# Patient Record
Sex: Female | Born: 1965 | Race: White | Hispanic: No | Marital: Single | Smoking: Never smoker
Health system: Southern US, Community
[De-identification: ages and names within clinical notes are randomized; demographics above are authoritative.]

## PROBLEM LIST (undated history)

## (undated) DIAGNOSIS — F32A Depression, unspecified: Secondary | ICD-10-CM

## (undated) DIAGNOSIS — J45909 Unspecified asthma, uncomplicated: Secondary | ICD-10-CM

## (undated) DIAGNOSIS — F329 Major depressive disorder, single episode, unspecified: Secondary | ICD-10-CM

## (undated) HISTORY — PX: FRACTURE SURGERY: SHX138

---

## 2018-01-25 ENCOUNTER — Other Ambulatory Visit: Payer: Self-pay

## 2018-01-25 ENCOUNTER — Encounter (HOSPITAL_COMMUNITY): Payer: Self-pay | Admitting: Emergency Medicine

## 2018-01-25 ENCOUNTER — Emergency Department (HOSPITAL_COMMUNITY)
Admission: EM | Admit: 2018-01-25 | Discharge: 2018-01-25 | Disposition: A | Discharge status: Home / Self Care | Payer: BLUE CROSS/BLUE SHIELD | Attending: Emergency Medicine | Admitting: Emergency Medicine

## 2018-01-25 DIAGNOSIS — Z23 Encounter for immunization: Secondary | ICD-10-CM | POA: Diagnosis not present

## 2018-01-25 DIAGNOSIS — Z2914 Encounter for prophylactic rabies immune globin: Secondary | ICD-10-CM | POA: Insufficient documentation

## 2018-01-25 DIAGNOSIS — Z203 Contact with and (suspected) exposure to rabies: Secondary | ICD-10-CM | POA: Diagnosis not present

## 2018-01-25 DIAGNOSIS — J45909 Unspecified asthma, uncomplicated: Secondary | ICD-10-CM | POA: Diagnosis not present

## 2018-01-25 HISTORY — DX: Major depressive disorder, single episode, unspecified: F32.9

## 2018-01-25 HISTORY — DX: Unspecified asthma, uncomplicated: J45.909

## 2018-01-25 HISTORY — DX: Depression, unspecified: F32.A

## 2018-01-25 MED ORDER — RABIES IMMUNE GLOBULIN 150 UNIT/ML IM INJ
1700.0000 [IU] | INJECTION | Freq: Once | INTRAMUSCULAR | Status: AC
  Administered 2018-01-25: 1725 [IU] via INTRAMUSCULAR
  Filled 2018-01-25: qty 11.5

## 2018-01-25 MED ORDER — RABIES IMMUNE GLOBULIN 150 UNIT/ML IM INJ
INJECTION | INTRAMUSCULAR | Status: AC
  Filled 2018-01-25: qty 2

## 2018-01-25 MED ORDER — RABIES VACCINE, PCEC IM SUSR
1.0000 mL | Freq: Once | INTRAMUSCULAR | Status: AC
  Administered 2018-01-25: 1 mL via INTRAMUSCULAR
  Filled 2018-01-25: qty 1

## 2018-01-25 MED ORDER — RABIES IMMUNE GLOBULIN 150 UNIT/ML IM INJ
INJECTION | INTRAMUSCULAR | Status: AC
  Filled 2018-01-25: qty 12

## 2018-01-25 MED ORDER — RABIES IMMUNE GLOBULIN 150 UNIT/ML IM INJ
20.0000 [IU]/kg | INJECTION | Freq: Once | INTRAMUSCULAR | Status: DC
  Filled 2018-01-25: qty 12

## 2018-01-25 NOTE — ED Triage Notes (Signed)
Patient reports possible exposure to rabies yesterday. Her dog fought with a skunk. Animal control got the skunk, was positive for rabies.

## 2018-01-25 NOTE — ED Provider Notes (Signed)
[facility] EMERGENCY DEPARTMENT Provider Note   CSN: [id number] Arrival date & time: 01/25/18  1235     History   Chief Complaint Chief Complaint  Patient presents with  . Animal Bite    no bite, patient possibly exposed to rabies    HPI Marwa Fuhrman is a 52 y.o. female.  HPI  Arya Luttrull is a 52 y.o. female who presents to the Emergency Department requesting rabies prophylaxis.  She states a skunk wandered into her yard yesterday and her dog fought with the skunk and may have been bitten by the skunk.  The skunk was tested and found to be positive for rabies.  She states that she was advised by animal control to start rabies vaccinations.  She states that she did not come in direct contact with a skunk, but she did check her dog for bites and may have gotten the animal's saliva on her.  She has no open wounds.  Denies pain or injury.   Past Medical History:  Diagnosis Date  . Asthma   . Depression     There are no active problems to display for this patient.   Past Surgical History:  Procedure Laterality Date  . FRACTURE SURGERY       OB History    Gravida  1   Para  1   Term  1   Preterm      AB      Living        SAB      TAB      Ectopic      Multiple      Live Births               Home Medications    Prior to Admission medications   Not on File    Family History Family History  Problem Relation Age of Onset  . AAA (abdominal aortic aneurysm) Mother     Social History Social History   Tobacco Use  . Smoking status: Never Smoker  . Smokeless tobacco: Never Used  Substance Use Topics  . Alcohol use: Never    Frequency: Never  . Drug use: Never     Allergies   Patient has no known allergies.   Review of Systems Review of Systems  Constitutional: Negative for activity change, appetite change, chills and fever.  Eyes: Negative for visual disturbance.  Respiratory: Negative for cough and shortness of breath.     Gastrointestinal: Negative for nausea and vomiting.  Musculoskeletal: Negative for neck pain and neck stiffness.  Skin: Negative for rash and wound.  Neurological: Negative for dizziness, weakness, numbness and headaches.  Hematological: Negative for adenopathy.  Psychiatric/Behavioral: Negative for confusion.     Physical Exam Updated Vital Signs BP 140/79 (BP Location: Right Arm)   Pulse 62   Temp 97.9 F (36.6 C) (Oral)   Resp 12   Ht 5' 2.5" (1.588 m)   Wt 85.1 kg   SpO2 99%   BMI 33.77 kg/m   Physical Exam  Constitutional: She appears well-nourished. No distress.  HENT:  Head: Atraumatic.  Cardiovascular: Normal rate, regular rhythm and intact distal pulses.  Pulmonary/Chest: Effort normal and breath sounds normal. No respiratory distress.  Musculoskeletal: Normal range of motion. She exhibits no tenderness.  Neurological: She is alert. No sensory deficit.  Skin: Skin is warm. Capillary refill takes less than 2 seconds.  No abrasions or open wounds  Psychiatric: She has a normal mood and affect.  Nursing note and vitals reviewed.    ED Treatments / Results  Labs (all labs ordered are listed, but only abnormal results are displayed) Labs Reviewed - No data to display  EKG None  Radiology No results found.  Procedures Procedures (including critical care time)  Medications Ordered in ED Medications  rabies vaccine (RABAVERT) injection 1 mL (1 mL Intramuscular Given 01/25/18 1437)  rabies immune globulin (HYPERAB/KEDRAB) injection 1,725 Units (1,725 Units Intramuscular Given 01/25/18 1440)     Initial Impression / Assessment and Plan / ED Course  I have reviewed the triage vital signs and the nursing notes.  Pertinent labs & imaging results that were available during my care of the patient were reviewed by me and considered in my medical decision making (see chart for details).     Patient with possible exposure to saliva of a skunk that tested  positive for rabies.  No open wounds.  Will initiate rabies prophylaxis.  She agrees to subsequent injections to be performed at short stay.  Final Clinical Impressions(s) / ED Diagnoses   Final diagnoses:  Need for post exposure prophylaxis for rabies    ED Discharge Orders    None       Kem Parkinson, PA-C 01/25/18 1552    Fredia Sorrow, MD 01/26/18 1250

## 2018-01-25 NOTE — Discharge Instructions (Addendum)
Someone from the short stay area should contact to to arrange appointment time for your follow-up vaccines.

## 2018-01-28 ENCOUNTER — Emergency Department (HOSPITAL_COMMUNITY)
Admission: EM | Admit: 2018-01-28 | Discharge: 2018-01-28 | Discharge status: Absent without leave | Payer: BLUE CROSS/BLUE SHIELD

## 2018-01-28 ENCOUNTER — Ambulatory Visit (HOSPITAL_COMMUNITY)
Admission: RE | Admit: 2018-01-28 | Discharge: 2018-01-28 | Disposition: A | Discharge status: Home / Self Care | Payer: BLUE CROSS/BLUE SHIELD | Source: Ambulatory Visit | Attending: Family Medicine | Admitting: Family Medicine

## 2018-01-28 DIAGNOSIS — Z23 Encounter for immunization: Secondary | ICD-10-CM | POA: Diagnosis not present

## 2018-01-28 DIAGNOSIS — Z203 Contact with and (suspected) exposure to rabies: Secondary | ICD-10-CM | POA: Insufficient documentation

## 2018-01-28 MED ORDER — RABIES VACCINE, PCEC IM SUSR
1.0000 mL | Freq: Once | INTRAMUSCULAR | Status: AC
  Administered 2018-01-28: 1 mL via INTRAMUSCULAR
  Filled 2018-01-28: qty 1

## 2018-01-28 NOTE — Progress Notes (Signed)
Rabies vaccine 1 C given in L delt without issue.

## 2018-02-01 ENCOUNTER — Encounter (HOSPITAL_COMMUNITY): Payer: Self-pay

## 2018-02-01 ENCOUNTER — Encounter (HOSPITAL_COMMUNITY)
Admission: RE | Admit: 2018-02-01 | Discharge: 2018-02-01 | Disposition: A | Discharge status: Home / Self Care | Payer: BLUE CROSS/BLUE SHIELD | Source: Ambulatory Visit | Attending: Emergency Medicine | Admitting: Emergency Medicine

## 2018-02-01 DIAGNOSIS — Z203 Contact with and (suspected) exposure to rabies: Secondary | ICD-10-CM | POA: Insufficient documentation

## 2018-02-01 DIAGNOSIS — Z2914 Encounter for prophylactic rabies immune globin: Secondary | ICD-10-CM | POA: Insufficient documentation

## 2018-02-01 MED ORDER — RABIES VACCINE, PCEC IM SUSR
1.0000 mL | Freq: Once | INTRAMUSCULAR | Status: AC
  Administered 2018-02-01: 1 mL via INTRAMUSCULAR

## 2018-02-08 ENCOUNTER — Encounter (HOSPITAL_COMMUNITY)
Admission: RE | Admit: 2018-02-08 | Discharge: 2018-02-08 | Disposition: A | Discharge status: Home / Self Care | Payer: BLUE CROSS/BLUE SHIELD | Source: Ambulatory Visit | Attending: Emergency Medicine | Admitting: Emergency Medicine

## 2018-02-08 ENCOUNTER — Encounter (HOSPITAL_COMMUNITY): Payer: Self-pay

## 2018-02-08 DIAGNOSIS — Z2914 Encounter for prophylactic rabies immune globin: Secondary | ICD-10-CM | POA: Diagnosis not present

## 2018-02-08 MED ORDER — RABIES VACCINE, PCEC IM SUSR
1.0000 mL | Freq: Once | INTRAMUSCULAR | Status: AC
  Administered 2018-02-08: 1 mL via INTRAMUSCULAR

## 2018-02-08 NOTE — Progress Notes (Signed)
Patient presented today for final subsequent Rabies injection.  States that left upper shoulder continues to hurt and is radiating into her bicep rates it a 10/10.  States is worse with movement and is unable to lift her arm beyond a 90 degree angle. Has an appointment at 9:00 tomorrow to see Dr. Lynann Bologna in [place] for evaluation.  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  Is currently taking Aleve and alternating ice and heat for comfort.  Advised that she saw a massage therapist last week, after her injection on 11/10, which she said helped temporarily.  There is no obvious visual outer tissue injury to shoulder.

## 2018-04-03 ENCOUNTER — Other Ambulatory Visit (HOSPITAL_COMMUNITY): Payer: Self-pay | Admitting: Orthopaedic Surgery

## 2018-04-03 ENCOUNTER — Ambulatory Visit (HOSPITAL_COMMUNITY)
Admission: RE | Admit: 2018-04-03 | Discharge: 2018-04-03 | Disposition: A | Discharge status: Home / Self Care | Payer: BLUE CROSS/BLUE SHIELD | Source: Ambulatory Visit | Attending: Orthopaedic Surgery | Admitting: Orthopaedic Surgery

## 2018-04-03 DIAGNOSIS — M79605 Pain in left leg: Secondary | ICD-10-CM | POA: Diagnosis present

## 2018-04-03 DIAGNOSIS — M7989 Other specified soft tissue disorders: Secondary | ICD-10-CM

## 2018-04-03 NOTE — Progress Notes (Signed)
Left lower extremity venous duplex has been completed. Negative for DVT. A cystic structure measuring 1.5 cm high by 3.4 cm wide by 3.7 cm long is found in the left popliteal fossa. Results were given to [facility] at Dr. Rich Fuchs office.  04/03/18 10:20 AM Carlos Levering RVT

## 2019-02-08 ENCOUNTER — Other Ambulatory Visit: Payer: Self-pay

## 2019-02-08 DIAGNOSIS — Z20822 Contact with and (suspected) exposure to covid-19: Secondary | ICD-10-CM

## 2019-02-11 LAB — NOVEL CORONAVIRUS, NAA: SARS-CoV-2, NAA: NOT DETECTED

## 2019-02-19 ENCOUNTER — Other Ambulatory Visit: Payer: Self-pay

## 2019-02-19 DIAGNOSIS — Z20822 Contact with and (suspected) exposure to covid-19: Secondary | ICD-10-CM

## 2019-02-21 LAB — NOVEL CORONAVIRUS, NAA: SARS-CoV-2, NAA: DETECTED — AB

## 2020-10-02 DIAGNOSIS — F32A Depression, unspecified: Secondary | ICD-10-CM | POA: Insufficient documentation

## 2020-10-02 DIAGNOSIS — R0989 Other specified symptoms and signs involving the circulatory and respiratory systems: Secondary | ICD-10-CM | POA: Insufficient documentation

## 2020-10-02 DIAGNOSIS — E782 Mixed hyperlipidemia: Secondary | ICD-10-CM | POA: Insufficient documentation

## 2020-10-02 DIAGNOSIS — D492 Neoplasm of unspecified behavior of bone, soft tissue, and skin: Secondary | ICD-10-CM | POA: Insufficient documentation

## 2020-10-02 DIAGNOSIS — J45909 Unspecified asthma, uncomplicated: Secondary | ICD-10-CM | POA: Insufficient documentation

## 2021-08-14 ENCOUNTER — Encounter (HOSPITAL_BASED_OUTPATIENT_CLINIC_OR_DEPARTMENT_OTHER): Payer: Self-pay

## 2021-08-14 ENCOUNTER — Emergency Department (HOSPITAL_BASED_OUTPATIENT_CLINIC_OR_DEPARTMENT_OTHER): Payer: BC Managed Care – PPO

## 2021-08-14 ENCOUNTER — Emergency Department (HOSPITAL_BASED_OUTPATIENT_CLINIC_OR_DEPARTMENT_OTHER)
Admission: EM | Admit: 2021-08-14 | Discharge: 2021-08-15 | Disposition: A | Discharge status: Home / Self Care | Payer: BC Managed Care – PPO | Attending: Emergency Medicine | Admitting: Emergency Medicine

## 2021-08-14 ENCOUNTER — Other Ambulatory Visit: Payer: Self-pay

## 2021-08-14 DIAGNOSIS — R531 Weakness: Secondary | ICD-10-CM | POA: Insufficient documentation

## 2021-08-14 DIAGNOSIS — G459 Transient cerebral ischemic attack, unspecified: Secondary | ICD-10-CM

## 2021-08-14 DIAGNOSIS — Z20822 Contact with and (suspected) exposure to covid-19: Secondary | ICD-10-CM | POA: Insufficient documentation

## 2021-08-14 DIAGNOSIS — R4781 Slurred speech: Secondary | ICD-10-CM | POA: Insufficient documentation

## 2021-08-14 DIAGNOSIS — R42 Dizziness and giddiness: Secondary | ICD-10-CM | POA: Diagnosis not present

## 2021-08-14 DIAGNOSIS — R791 Abnormal coagulation profile: Secondary | ICD-10-CM | POA: Insufficient documentation

## 2021-08-14 DIAGNOSIS — H532 Diplopia: Secondary | ICD-10-CM | POA: Insufficient documentation

## 2021-08-14 DIAGNOSIS — Z79899 Other long term (current) drug therapy: Secondary | ICD-10-CM | POA: Insufficient documentation

## 2021-08-14 LAB — CBC
HCT: 38.1 % (ref 36.0–46.0)
Hemoglobin: 12.9 g/dL (ref 12.0–15.0)
MCH: 30.2 pg (ref 26.0–34.0)
MCHC: 33.9 g/dL (ref 30.0–36.0)
MCV: 89.2 fL (ref 80.0–100.0)
Platelets: 307 10*3/uL (ref 150–400)
RBC: 4.27 MIL/uL (ref 3.87–5.11)
RDW: 13.1 % (ref 11.5–15.5)
WBC: 7.4 10*3/uL (ref 4.0–10.5)
nRBC: 0 % (ref 0.0–0.2)

## 2021-08-14 LAB — URINALYSIS, ROUTINE W REFLEX MICROSCOPIC
Bilirubin Urine: NEGATIVE
Glucose, UA: NEGATIVE mg/dL
Ketones, ur: NEGATIVE mg/dL
Nitrite: NEGATIVE
Protein, ur: NEGATIVE mg/dL
Specific Gravity, Urine: 1.009 (ref 1.005–1.030)
pH: 6.5 (ref 5.0–8.0)

## 2021-08-14 LAB — DIFFERENTIAL
Abs Immature Granulocytes: 0.02 10*3/uL (ref 0.00–0.07)
Basophils Absolute: 0 10*3/uL (ref 0.0–0.1)
Basophils Relative: 0 %
Eosinophils Absolute: 0.2 10*3/uL (ref 0.0–0.5)
Eosinophils Relative: 3 %
Immature Granulocytes: 0 %
Lymphocytes Relative: 40 %
Lymphs Abs: 3 10*3/uL (ref 0.7–4.0)
Monocytes Absolute: 0.4 10*3/uL (ref 0.1–1.0)
Monocytes Relative: 5 %
Neutro Abs: 3.8 10*3/uL (ref 1.7–7.7)
Neutrophils Relative %: 52 %

## 2021-08-14 LAB — COMPREHENSIVE METABOLIC PANEL
ALT: 12 U/L (ref 0–44)
AST: 19 U/L (ref 15–41)
Albumin: 4.3 g/dL (ref 3.5–5.0)
Alkaline Phosphatase: 61 U/L (ref 38–126)
Anion gap: 10 (ref 5–15)
BUN: 16 mg/dL (ref 6–20)
CO2: 26 mmol/L (ref 22–32)
Calcium: 9.3 mg/dL (ref 8.9–10.3)
Chloride: 102 mmol/L (ref 98–111)
Creatinine, Ser: 0.93 mg/dL (ref 0.44–1.00)
GFR, Estimated: >60 mL/min (ref >60)
Glucose, Bld: 123 mg/dL — ABNORMAL HIGH (ref 70–99)
Potassium: 4.3 mmol/L (ref 3.5–5.1)
Sodium: 138 mmol/L (ref 135–145)
Total Bilirubin: 0.2 mg/dL — ABNORMAL LOW (ref 0.3–1.2)
Total Protein: 7.4 g/dL (ref 6.5–8.1)

## 2021-08-14 LAB — PROTIME-INR
INR: 0.9 (ref 0.8–1.2)
Prothrombin Time: 12.4 seconds (ref 11.4–15.2)

## 2021-08-14 LAB — RAPID URINE DRUG SCREEN, HOSP PERFORMED
Amphetamines: NOT DETECTED
Barbiturates: NOT DETECTED
Benzodiazepines: NOT DETECTED
Cocaine: NOT DETECTED
Opiates: NOT DETECTED
Tetrahydrocannabinol: NOT DETECTED

## 2021-08-14 LAB — CBG MONITORING, ED: Glucose-Capillary: 126 mg/dL — ABNORMAL HIGH (ref 70–99)

## 2021-08-14 LAB — APTT: aPTT: 30 seconds (ref 24–36)

## 2021-08-14 LAB — ETHANOL: Alcohol, Ethyl (B): <10 mg/dL (ref <10)

## 2021-08-14 LAB — RESP PANEL BY RT-PCR (FLU A&B, COVID) ARPGX2
Influenza A by PCR: NEGATIVE
Influenza B by PCR: NEGATIVE
SARS Coronavirus 2 by RT PCR: NEGATIVE

## 2021-08-14 LAB — PREGNANCY, URINE: Preg Test, Ur: NEGATIVE

## 2021-08-14 IMAGING — CT CT HEAD CODE STROKE
3 of 4 series · 14 of 47 positions shown, 16 images · non-contrast
Comparison: None Available.

CLINICAL DATA: Code stroke. Dizzy, double vision, weakness, slurred
speech



[Series 3: head wo · axial · 0.42mm/px · z∈[+651,+771]mm · 8 of 29 slices shown, 10 images]
[im 3/29  brain]
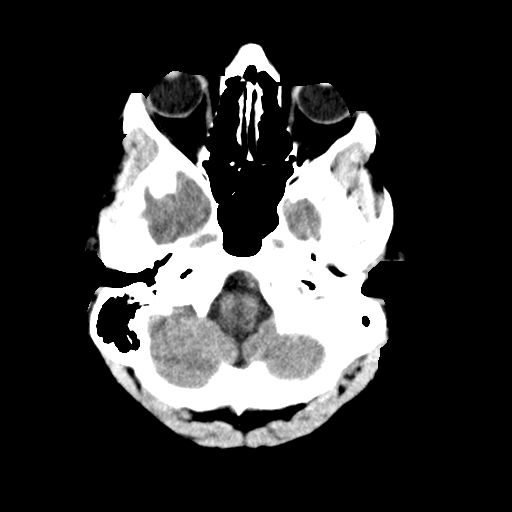
[im 3/29  bone]
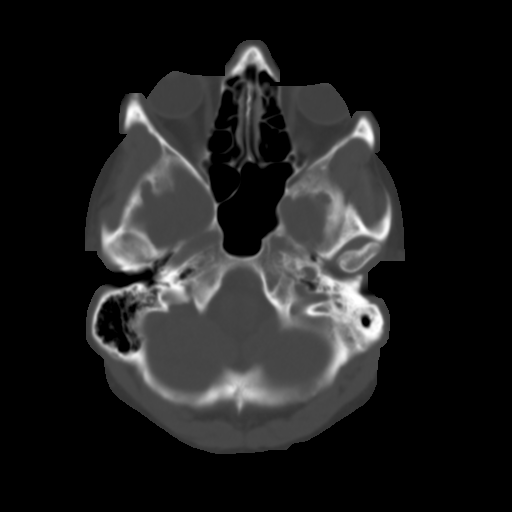
[im 7/29  brain]
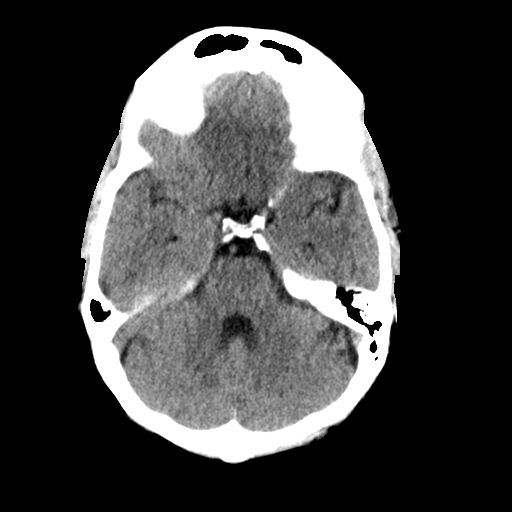
[im 11/29  brain]
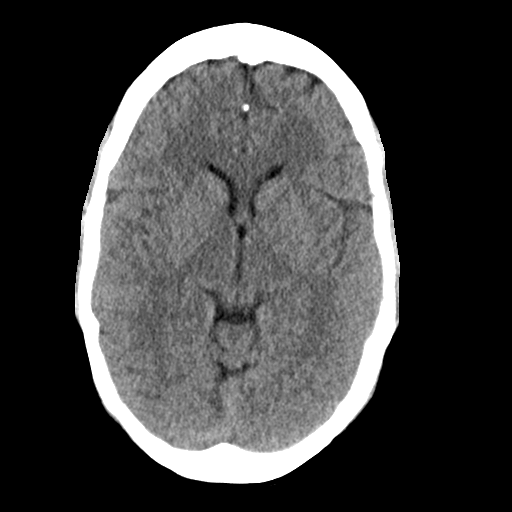
[im 13/29  brain]
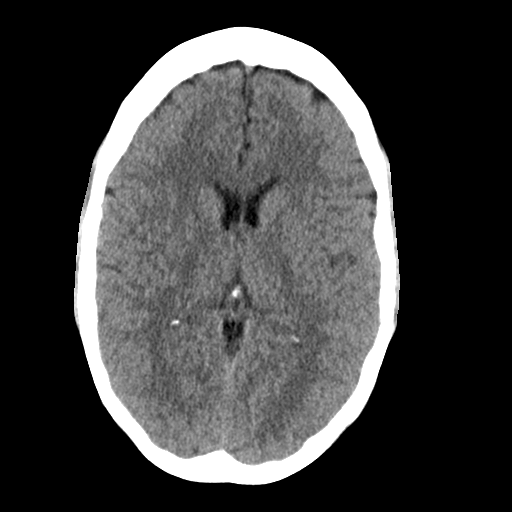
[im 17/29  brain]
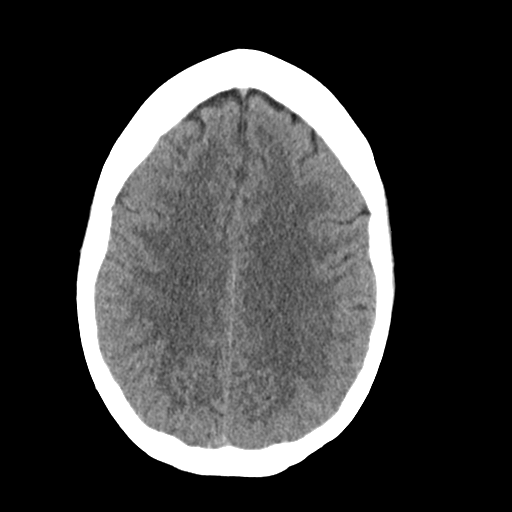
[im 17/29  bone]
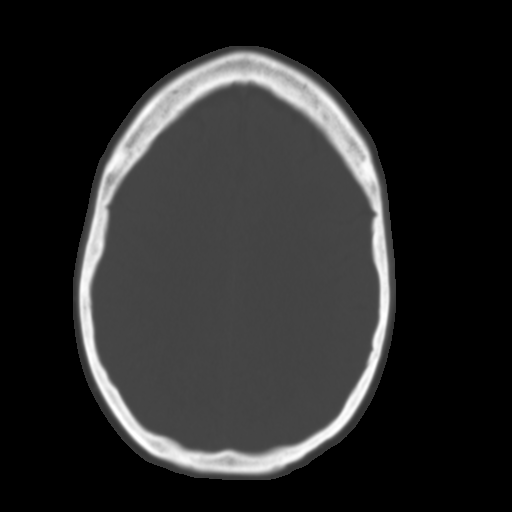
[im 19/29  brain]
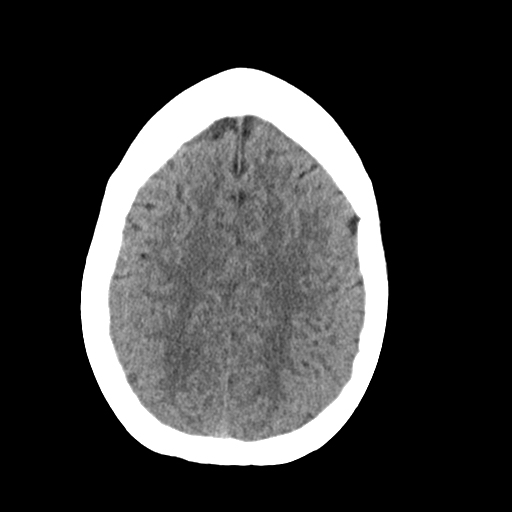
[im 23/29  brain]
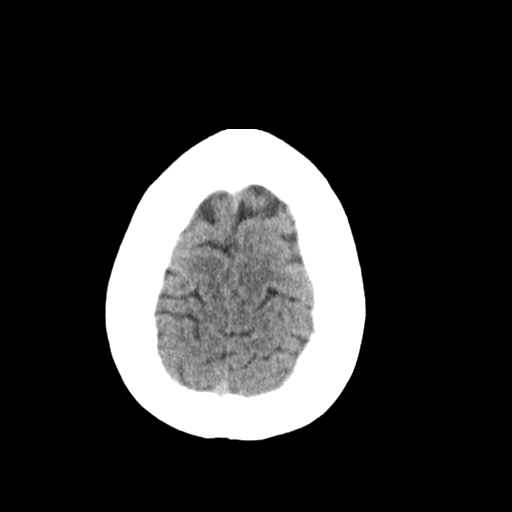
[im 27/29  brain]
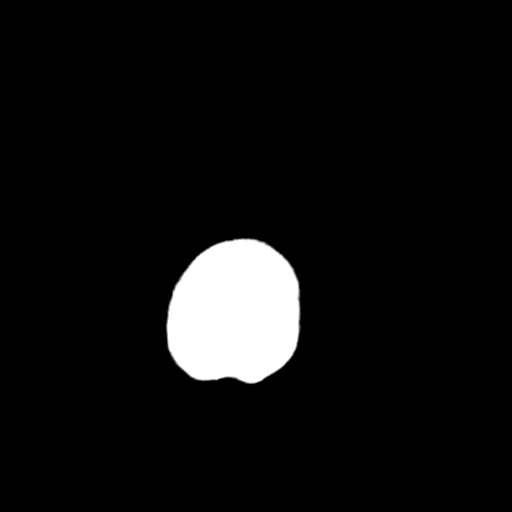

[Series 5: coronal soft · coronal · 0.30mm/px · 3 of 70 slices shown]
[im 24/70  brain]
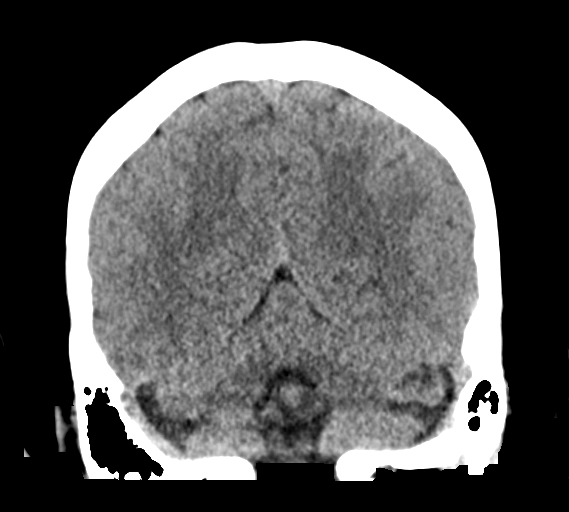
[im 31/70  brain]
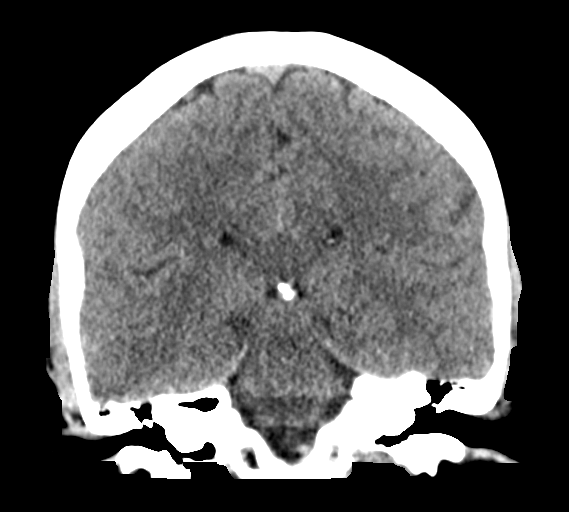
[im 39/70  brain]
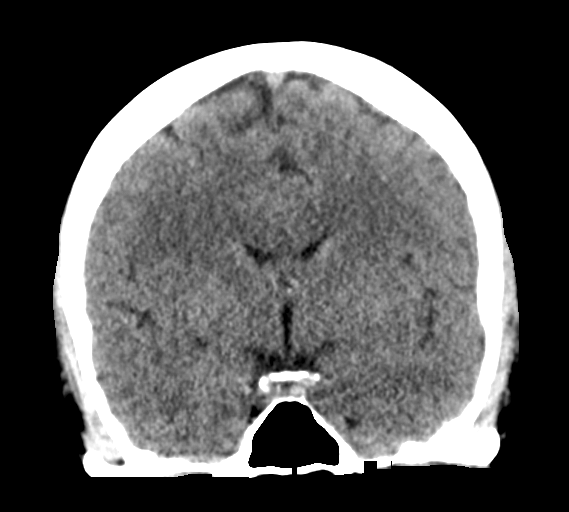

[Series 6: sagittal soft · sagittal · 0.30mm/px · 3 of 57 slices shown]
[im 19/57  brain]
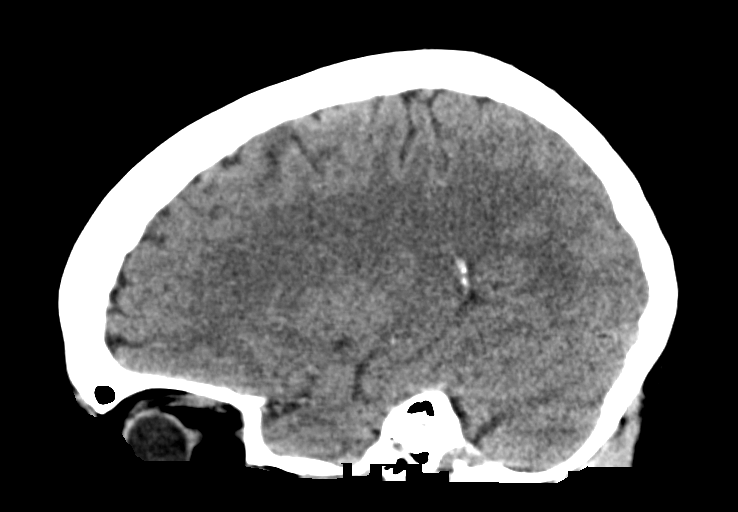
[im 29/57  brain]
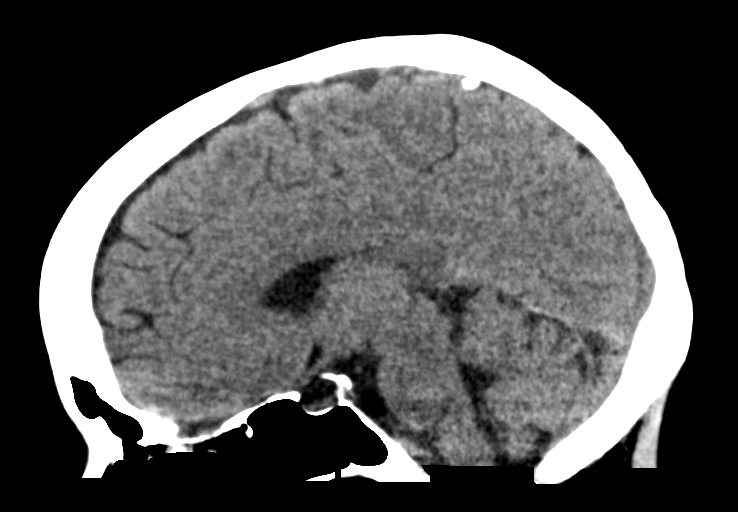
[im 38/57  brain]
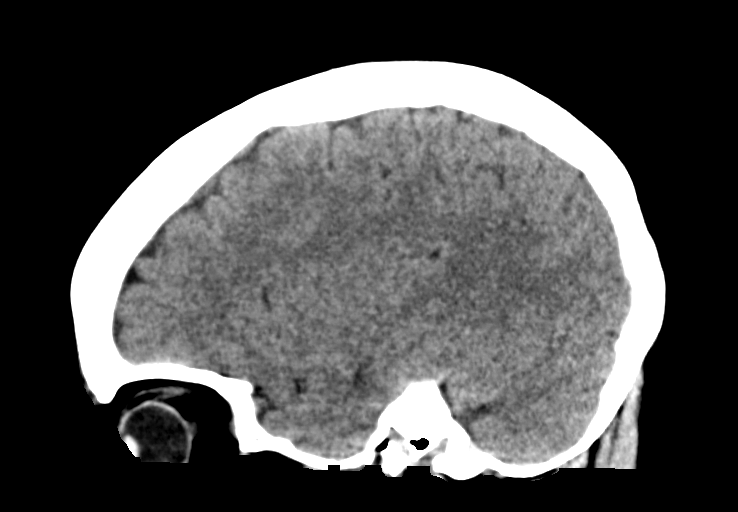

[14 of 47 positions shown; findings below may reference images not displayed]

FINDINGS: Brain: No evidence of acute infarction, hemorrhage, cerebral edema,
mass, mass effect, or midline shift. Ventricles and sulci are normal
for age. No extra-axial fluid collection.

Vascular: No hyperdense vessel.

Skull: Negative for fracture or focal lesion.

Sinuses/Orbits: No acute finding.

Other: The mastoid air cells are well aerated.

ASPECTS (Alberta Stroke Program Early CT Score)

- Ganglionic level infarction (caudate, lentiform nuclei, internal
capsule, insula, M1-M3 cortex): 7

- Supraganglionic infarction (M4-M6 cortex): 3

Total score (0-10 with 10 being normal): 10
IMPRESSION: 1. No acute intracranial process.
2. ASPECTS is 10

Code stroke imaging results were communicated on [DATE] at [DATE] to provider JIM via telephone, who verbally acknowledged
these results.

## 2021-08-14 MED ORDER — LORAZEPAM 2 MG/ML IJ SOLN: 1.0000 mg | Freq: Once | INTRAMUSCULAR | Status: DC | PRN

## 2021-08-14 MED ORDER — ATORVASTATIN CALCIUM 40 MG PO TABS
40.0000 mg | ORAL_TABLET | Freq: Once | ORAL | Status: AC
  Administered 2021-08-14: 40 mg via ORAL
  Filled 2021-08-14: qty 1

## 2021-08-14 MED ORDER — ASPIRIN 81 MG PO CHEW
324.0000 mg | CHEWABLE_TABLET | Freq: Once | ORAL | Status: AC
  Administered 2021-08-14: 324 mg via ORAL
  Filled 2021-08-14: qty 4

## 2021-08-14 MED ORDER — IOHEXOL 350 MG/ML SOLN
100.0000 mL | Freq: Once | INTRAVENOUS | Status: AC | PRN
Start: 2021-08-14 — End: 2021-08-14
  Administered 2021-08-14: 100 mL via INTRAVENOUS

## 2021-08-14 NOTE — Discharge Instructions (Signed)
Go straight to [facility].  There you will get an MRI and an MRA to evaluate your brain and blood vessels of your head and neck.  If at any point you develop new or worsening symptoms, pull over and call 911.

## 2021-08-14 NOTE — Consult Note (Signed)
[facility] Consult Services   Patient Name:   Kelsey Green, Kelsey Green Date of Birth:   [birth year] Identification Number:   MRN - [id number] Date of Service:   08/14/2021 20:31:16  Diagnosis:       G45.9 - Transient cerebral ischemic attack, unspecified  Impression:      This is a 56 y.o. female with anxiety and depression who presents for transient double vision, slurred speech, and weakness. She was watching Family Feud with her husband when she suddenly slumped to the right, had double vision and slurred speech, and felt off balance. This quickly improved, though, and at this time she feels back to normal except "a little tired". NIHSS is 0 at this time. She has never had anything like this before.    Presentation could be consistent with TIA most likely, versus less likely focal seizure or conversion disorder. She was not a tPA/TNK candidate due to improvement in symptoms. Management below.  Recommendations:        Stroke/Telemetry Floor       Neuro Checks       Bedside Swallow Eval       DVT Prophylaxis       Euglycemia and Avoid Hyperthermia (PRN Acetaminophen)       Systolic BP Cap <409        Aspirin 325 mg once now, then 81 mg daily.       Atorvastatin 40 mg daily.        Brain MRI without contrast and MRA head without contrast and MRA neck with contrast        If a small acute stroke is seen on MRI brain, without hemorrhage, also give 300 mg clopidogrel one time, followed by 21 days of clopidogrel 75 mg daily. If significant intracranial atherosclerosis is seen on CTA/MRA, continue the Plavix for 90 days instead of 21 days.        If an acute or subacute embolic-appearing stroke (i.e. not lacunar) is seen on MRI brain, obtain TTE in hospital and Holter monitor on discharge. If no acute stroke is seen on MRI brain, or lacunar stroke is seen, these tests are not needed.        HbA1C, Lipid Panel, TSH  Sign Out:       Discussed with Emergency  Department Provider        ------------------------------------------------------------------------------  Advanced Imaging: Advanced Imaging Deferred because:  symptoms resolved, not currently consistent with LVO.   Metrics: Last Known Well: 08/14/2021 19:30:00 [facility] Notification Time: 08/14/2021 20:31:16 Arrival Time: 08/14/2021 20:06:00 Stamp Time: 08/14/2021 20:31:16 Initial Response Time: 08/14/2021 20:35:15 Symptoms: Transient double vision. Initial patient interaction: 08/14/2021 20:35:15 NIHSS Assessment Completed: 08/14/2021 20:35:15 Patient is not a candidate for Thrombolytic. Thrombolytic Medical Decision: 08/14/2021 20:39:00 Patient was not deemed candidate for Thrombolytic because of following reasons: Resolved symptoms (no residual disabling symptoms). No disabling symptoms.  I personally Reviewed the CT Head and it Showed no acute pathology  Primary Provider Notified of Diagnostic Impression and Management Plan on: 08/14/2021 20:49:56    ------------------------------------------------------------------------------  History of Present Illness: Patient is a 56 year old Female.  Patient was brought by private transportation with symptoms of Transient double vision. This is a 56 y.o. female with anxiety and depression who presents for transient double vision, slurred speech, and weakness. She was watching Family Feud with her husband when she suddenly slumped to the right, had double vision and slurred speech, and felt off balance. This quickly improved, though, and at this time  she feels back to normal except "a little tired". NIHSS is 0 at this time. She has never had anything like this before.   Past Medical History:      There is no history of Hypertension      There is no history of Diabetes Mellitus      There is no history of Hyperlipidemia      There is no history of Stroke      There is no history of Seizures Othere PMH:  Depression  and anxiety.  Medications:  No Anticoagulant use  No Antiplatelet use Reviewed EMR for current medications  Allergies:  Reviewed  Social History: Drug Use: No  Family History:  There is no family history of premature cerebrovascular disease pertinent to this consultation  ROS : 14 Points Review of Systems was performed and was negative except mentioned in HPI.  Past Surgical History: There Is No Surgical History Contributory To Today's Visit    Examination: BP(164/80), Pulse(65), Blood Glucose(126) 1A: Level of Consciousness - Alert; keenly responsive + 0 1B: Ask Month and Age - Both Questions Right + 0 1C: Blink Eyes & Squeeze Hands - Performs Both Tasks + 0 2: Test Horizontal Extraocular Movements - Normal + 0 3: Test Visual Fields - No Visual Loss + 0 4: Test Facial Palsy (Use Grimace if Obtunded) - Normal symmetry + 0 5A: Test Left Arm Motor Drift - No Drift for 10 Seconds + 0 5B: Test Right Arm Motor Drift - No Drift for 10 Seconds + 0 6A: Test Left Leg Motor Drift - No Drift for 5 Seconds + 0 6B: Test Right Leg Motor Drift - No Drift for 5 Seconds + 0 7: Test Limb Ataxia (FNF/Heel-Shin) - No Ataxia + 0 8: Test Sensation - Normal; No sensory loss + 0 9: Test Language/Aphasia - Normal; No aphasia + 0 10: Test Dysarthria - Normal + 0 11: Test Extinction/Inattention - No abnormality + 0  NIHSS Score: 0   Pre-Morbid Modified Rankin Scale: 0 Points = No symptoms at all   Patient/Family was informed the Neurology Consult would occur via TeleHealth consult by way of interactive audio and video telecommunications and consented to receiving care in this manner.   Patient is being evaluated for possible acute neurologic impairment and high probability of imminent or life-threatening deterioration. I spent total of 35 minutes providing care to this patient, including time for face to face visit via telemedicine, review of medical records, imaging studies and discussion  of findings with providers, the patient and/or family.   Dr Allena Earing   [facility] [phone]   Case [id number]

## 2021-08-14 NOTE — ED Triage Notes (Signed)
MD bedside

## 2021-08-14 NOTE — ED Notes (Signed)
Pt now being escorted to hall bathroom via w/c by staff nurse at this time.

## 2021-08-14 NOTE — ED Notes (Signed)
ED Provider at bedside. 

## 2021-08-14 NOTE — ED Notes (Signed)
Telestroke bedside, pt in CT

## 2021-08-14 NOTE — ED Notes (Signed)
Patient transported to CT via w/c -- out of room at this time.  Fiancee at bedside reporting LKNT 1930HRS --  pt observed leaning/falling over to right followed by confusion, speech and visual changes.

## 2021-08-14 NOTE — ED Provider Notes (Signed)
[facility] EMERGENCY DEPT Provider Note   CSN: [id number] Arrival date & time: 08/14/21  2006     History  Chief Complaint  Patient presents with   Code Stroke    Kelsey Green is a 56 y.o. female.  HPI 56 year old female presents with sudden onset of strokelike symptoms.  History is primarily from the fianc.  He was with her and they were watching family feud when all of a sudden she leaned over onto him, which is to her right.  She had dizziness like she was drunk and double vision, weakness and was unable to walk.  Her speech was slurred.  She denies a headache. She has a history of prior vertigo but this is different.  Home Medications Prior to Admission medications   Medication Sig Start Date End Date Taking? Authorizing Provider  naproxen sodium (ALEVE) 220 MG tablet Take 220 mg by mouth daily as needed. 01/28/18   [provider]      Allergies    Patient has no known allergies.    Review of Systems   Review of Systems  Eyes:  Positive for visual disturbance.  Neurological:  Positive for dizziness and speech difficulty. Negative for headaches.   Physical Exam Updated Vital Signs BP (!) 164/81   Pulse 65   Temp 98.3 F (36.8 C) (Oral)   Resp 17   Ht '5\' 2"'$  (1.575 m)   Wt 83.2 kg   SpO2 97%   BMI 33.56 kg/m  Physical Exam Vitals and nursing note reviewed.  Constitutional:      Appearance: She is well-developed.  HENT:     Head: Normocephalic and atraumatic.  Eyes:     Extraocular Movements: Extraocular movements intact.     Pupils: Pupils are equal, round, and reactive to light.     Comments: No obvious nystagmus  Cardiovascular:     Rate and Rhythm: Normal rate and regular rhythm.     Heart sounds: Normal heart sounds.  Pulmonary:     Effort: Pulmonary effort is normal.     Breath sounds: Normal breath sounds.  Abdominal:     Palpations: Abdomen is soft.     Tenderness: There is no abdominal tenderness.  Skin:    General:  Skin is warm and dry.  Neurological:     Mental Status: She is alert.     Comments: Speech is slow but not slurred. CN 3-12 grossly intact. 5/5 strength in all 4 extremities. Grossly normal sensation. Normal finger to nose. However she is ataxic when trying to stand up to walk    ED Results / Procedures / Treatments   Labs (all labs ordered are listed, but only abnormal results are displayed) Labs Reviewed  CBG MONITORING, ED - Abnormal; Notable for the following components:      Result Value   Glucose-Capillary 126 (*)    All other components within normal limits  RESP PANEL BY RT-PCR (FLU A&B, COVID) ARPGX2  PROTIME-INR  APTT  CBC  DIFFERENTIAL  ETHANOL  COMPREHENSIVE METABOLIC PANEL  RAPID URINE DRUG SCREEN, HOSP PERFORMED  URINALYSIS, ROUTINE W REFLEX MICROSCOPIC  PREGNANCY, URINE    EKG EKG Interpretation  Date/Time:  Saturday Aug 14 2021 20:34:11 EDT Ventricular Rate:  61 PR Interval:  160 QRS Duration: 104 QT Interval:  409 QTC Calculation: 412 R Axis:   -9 Text Interpretation: Sinus rhythm Low voltage, precordial leads No old tracing to compare Confirmed by Sherwood Gambler [phone]) on 08/14/2021 8:36:57 PM  Radiology CT  HEAD CODE STROKE WO CONTRAST  Result Date: 08/14/2021 CLINICAL DATA:  Code stroke. Dizzy, double vision, weakness, slurred speech EXAM: CT HEAD WITHOUT CONTRAST TECHNIQUE: Contiguous axial images were obtained from the base of the skull through the vertex without intravenous contrast. RADIATION DOSE REDUCTION: This exam was performed according to the departmental dose-optimization program which includes automated exposure control, adjustment of the mA and/or kV according to patient size and/or use of iterative reconstruction technique. COMPARISON:  None Available. FINDINGS: Brain: No evidence of acute infarction, hemorrhage, cerebral edema, mass, mass effect, or midline shift. Ventricles and sulci are normal for age. No extra-axial fluid collection.  Vascular: No hyperdense vessel. Skull: Negative for fracture or focal lesion. Sinuses/Orbits: No acute finding. Other: The mastoid air cells are well aerated. ASPECTS [facility] Stroke Program Early CT Score) - Ganglionic level infarction (caudate, lentiform nuclei, internal capsule, insula, M1-M3 cortex): 7 - Supraganglionic infarction (M4-M6 cortex): 3 Total score (0-10 with 10 being normal): 10 IMPRESSION: 1. No acute intracranial process. 2. ASPECTS is 10 Code stroke imaging results were communicated on 08/14/2021 at 8:43 pm to provider Gimena Buick via telephone, who verbally acknowledged these results. Electronically Signed   By: Merilyn Baba M.D.   On: 08/14/2021 20:44    Procedures Procedures    Medications Ordered in ED Medications  iohexol (OMNIPAQUE) 350 MG/ML injection 100 mL (100 mLs Intravenous Contrast Given 08/14/21 2024)    ED Course/ Medical Decision Making/ A&P                           Medical Decision Making Amount and/or Complexity of Data Reviewed Independent Historian:     Details: fiancee External Data Reviewed: notes. Labs: ordered. Radiology: ordered and independent interpretation performed. ECG/medicine tests: ordered and independent interpretation performed.  Risk Prescription drug management.   Patient presents with strokelike symptoms and was called a code stroke on arrival.  She has trouble ambulating and with her transient slurred speech and double vision I am concerned about posterior circulation stroke.  CT head images viewed/interpreted by myself.  No head bleed.  Labs are coming back and are fairly unremarkable besides a slightly high glucose of 126.  ECG without acute ischemia.  No chest pain.  Discussed with teleneurology on-call, Dr. Kathrin Ruddy, who has personally evaluated patient.  At this point the patient symptoms rapidly improved and are now essentially gone.  We will not give TNK.  He does advise 325 mg aspirin, 40 mg atorvastatin now.  Advises MRI of  brain and MRA head and neck.  I discussed this further with the patient given where we are located as a freestanding emergency department.  He feels that if these are negative then it would be reasonable to discharge her and have her follow-up with outpatient neurology.  There is concern this was a TIA.  Thus decision made to transfer emergency department to emergency department to [facility] for MRI and MRA.  I discussed this with Dr. Wyvonnia Dusky.  Patient will go POV with her fianc.       Final Clinical Impression(s) / ED Diagnoses Final diagnoses:  None    Rx / DC Orders ED Discharge Orders     None         Sherwood Gambler, MD 08/14/21 2116

## 2021-08-14 NOTE — Progress Notes (Signed)
Code Stroke activated @ 2016.  Pt in CT at this time.  Returned @ 2030.  LKWT 1900 per significant other.  C/o being off balance with slurred speech and double vision.  Symptoms have resolved.  NCCT neg for bleed.  TSMD on camera @ 2035.  No TNK per [facility].

## 2021-08-14 NOTE — ED Triage Notes (Addendum)
POV, 1900 LKW, pt husband sts that she was watching tv, pt became dizzy, double vision, weakness, unable to walk, speech was slurred, pt sts she became sleepy, BGL 126

## 2021-08-15 ENCOUNTER — Emergency Department (HOSPITAL_COMMUNITY): Payer: BC Managed Care – PPO

## 2021-08-15 IMAGING — MR MR MRA HEAD W/O CM
1 series · 18 of 48 positions shown · IV contrast (gadavist)
Comparison: No prior MRI, correlation is made with CT head
[DATE]

CLINICAL DATA: Stroke-like symptoms

EXAM:
MRI HEAD WITHOUT CONTRAST
MRA HEAD WITHOUT CONTRAST
MRA NECK WITHOUT AND WITH CONTRAST
TECHNIQUE: Multiplanar, multi-echo pulse sequences of the brain and surrounding
structures were acquired without intravenous contrast. Angiographic
images of the Circle of Willis were acquired using MRA technique
without intravenous contrast. Angiographic images of the neck were
acquired using MRA technique without and with intravenous contrast.
Carotid stenosis measurements (when applicable) are obtained
utilizing NASCET criteria, using the distal internal carotid
diameter as the denominator.
CONTRAST:  8.5mL GADAVIST GADOBUTROL 1 MMOL/ML IV SOLN

[Series 9: 3d cow · axial · 0.5mm · 0.41mm/px · z∈[-74,+1]mm · 18 of 160 slices shown]
[im 1/160]
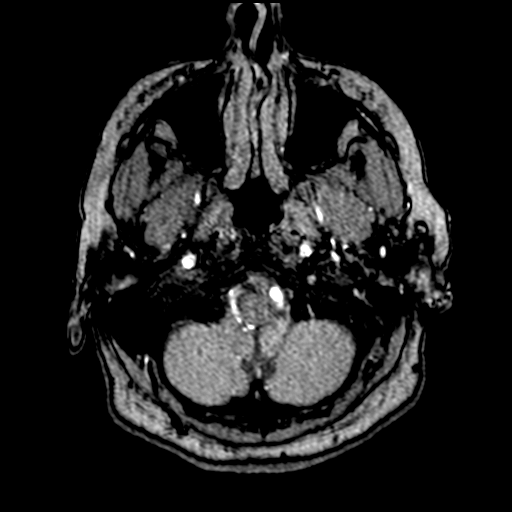
[im 4/160]
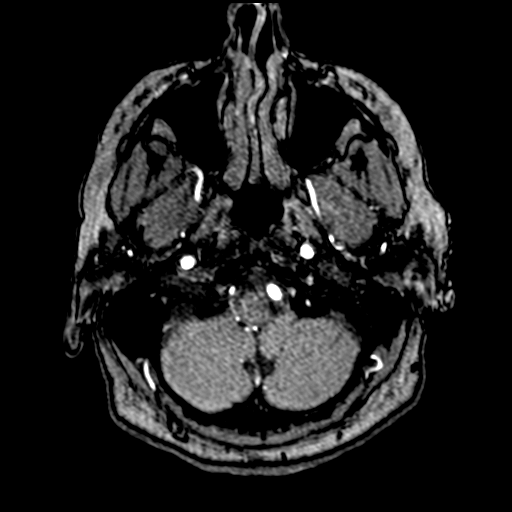
[im 7/160]
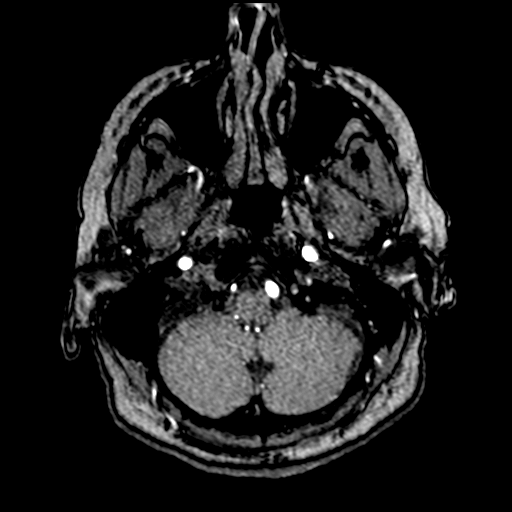
[im 11/160]
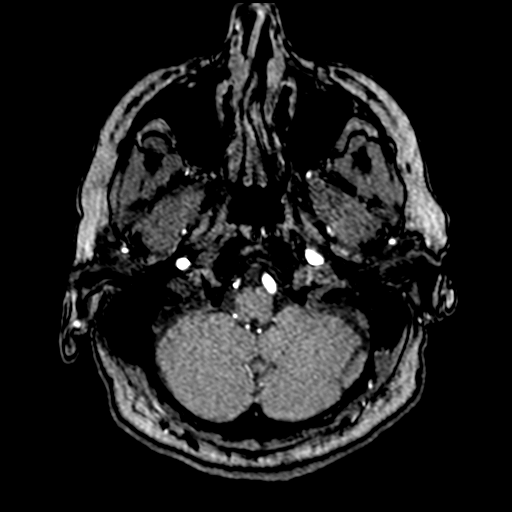
[im 14/160]
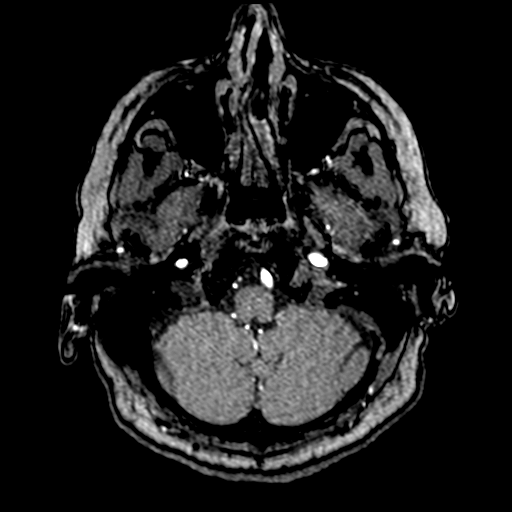
[im 17/160]
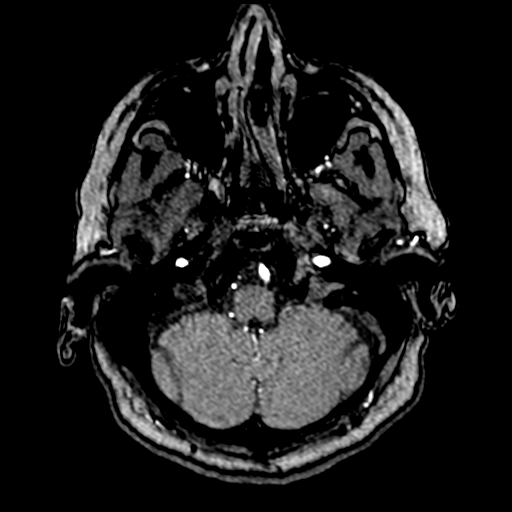
[im 21/160]
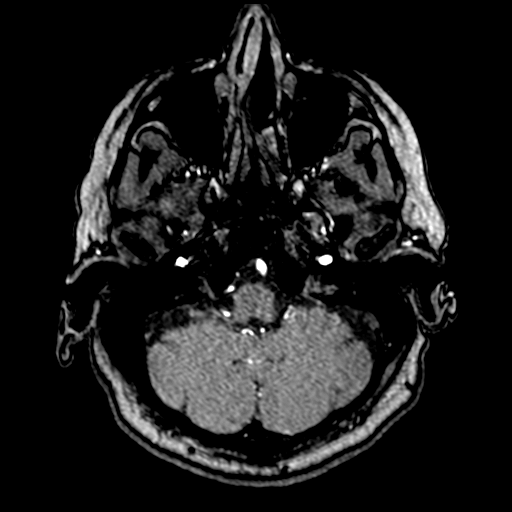
[im 24/160]
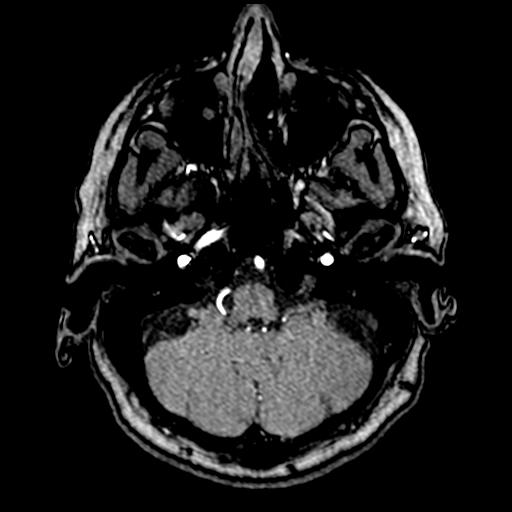
[im 28/160]
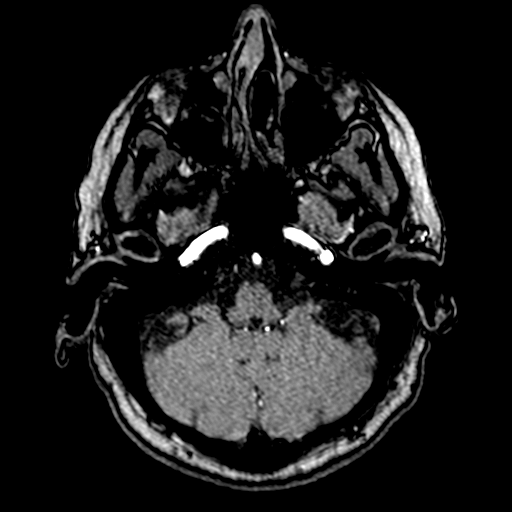
[im 31/160]
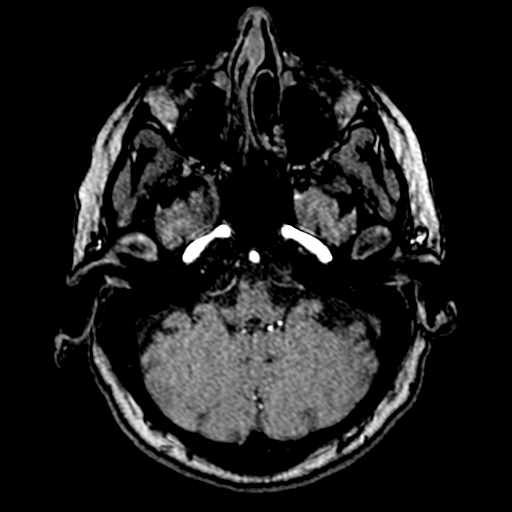
[im 51/160]
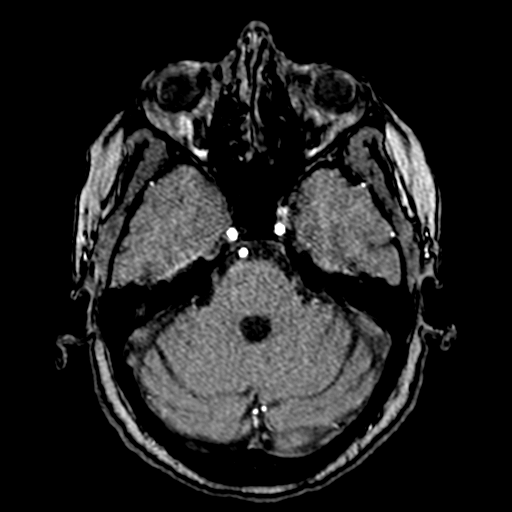
[im 72/160]
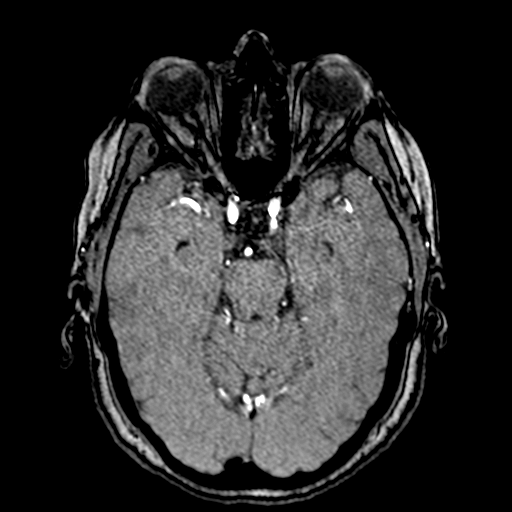
[im 82/160]
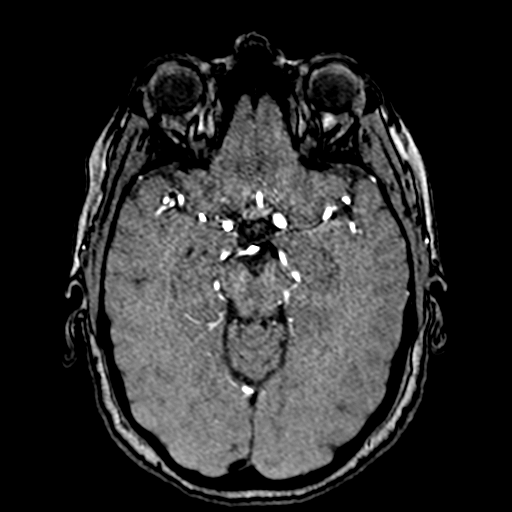
[im 92/160]
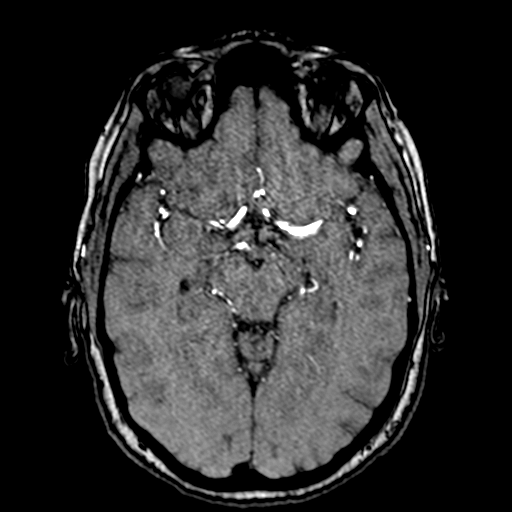
[im 112/160]
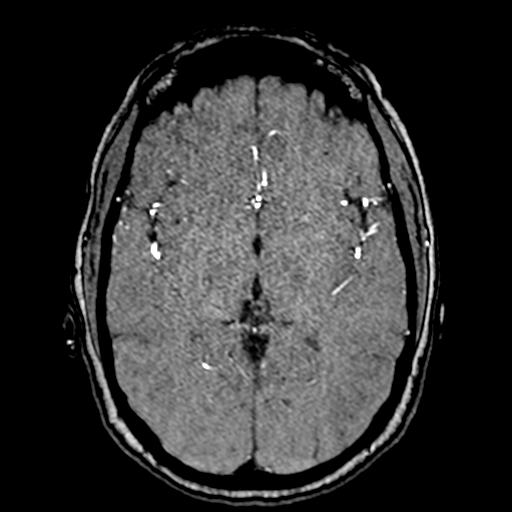
[im 132/160]
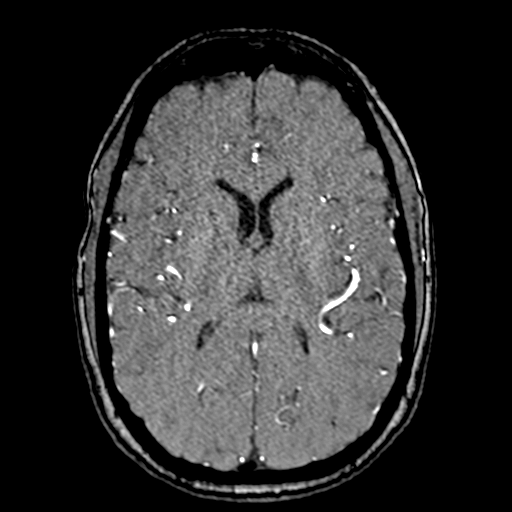
[im 136/160]
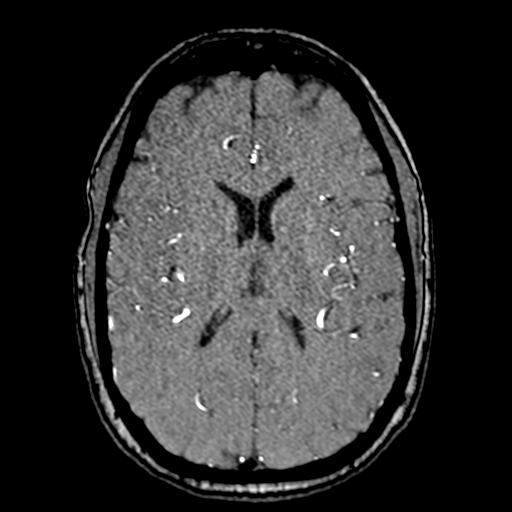
[im 153/160]
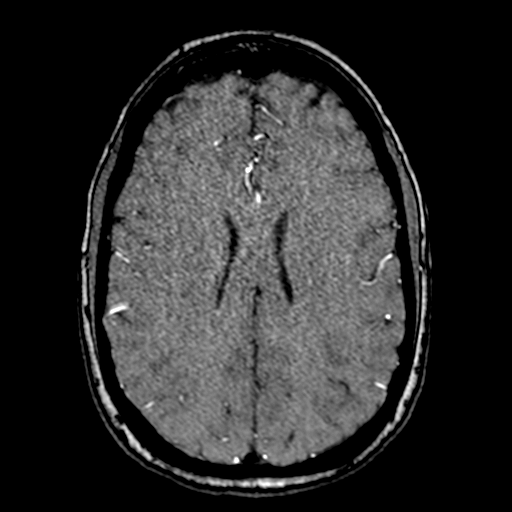

[18 of 48 positions shown; findings below may reference images not displayed]

FINDINGS: MRI HEAD FINDINGS

Brain: No restricted diffusion to suggest acute or subacute infarct.
No acute hemorrhage, mass, mass effect, or midline shift. No
hydrocephalus or extra-axial collection.

Vascular: Please see MRA findings below.

Skull and upper cervical spine: Normal marrow signal.

Sinuses/Orbits: No acute finding.

Other: None.

MRA HEAD FINDINGS

Evaluation is somewhat limited by motion artifact.

Anterior circulation: Both internal carotid arteries are patent to
the termini, without significant stenosis.

A1 segments patent. Normal anterior communicating artery. Anterior
cerebral arteries are patent to their distal aspects.

No M1 stenosis or occlusion. Normal MCA bifurcations. Distal MCA
branches perfused and symmetric.

Posterior circulation:

Vertebral arteries patent to the vertebrobasilar junction without
stenosis, although the non dominant right vertebral artery primarily
supplies PICA. Posterior inferior cerebral arteries patent
bilaterally.

Basilar patent to its distal aspect. Superior cerebellar arteries
patent bilaterally.

Patent P1 segments. PCAs perfused to their distal aspects without
stenosis. The bilateral posterior communicating arteries are not
visualized.

Anatomic variants: None significant

MRA NECK FINDINGS

Aortic arch: Standard aortic branching. No evidence of aneurysm or
dissection.

Right carotid system: No evidence of dissection, occlusion, or
hemodynamically significant stenosis (greater than 50%).

Left carotid system: No evidence of dissection, occlusion, or
hemodynamically significant stenosis (greater than 50%).

Vertebral arteries: Left dominant. No evidence of dissection,
occlusion, or hemodynamically significant stenosis (greater than
50%).

Other: None
IMPRESSION: 1.  No acute intracranial process.
2.  No intracranial large vessel occlusion or significant stenosis.
3.  No hemodynamically significant stenosis in the neck.

## 2021-08-15 IMAGING — MR MR MRA NECK WO/W CM
4 of 5 series · 27 of 48 positions shown · IV contrast (Gadavist)
Comparison: No prior MRI, correlation is made with CT head
[DATE]

CLINICAL DATA: Stroke-like symptoms

EXAM:
MRI HEAD WITHOUT CONTRAST
MRA HEAD WITHOUT CONTRAST
MRA NECK WITHOUT AND WITH CONTRAST
TECHNIQUE: Multiplanar, multi-echo pulse sequences of the brain and surrounding
structures were acquired without intravenous contrast. Angiographic
images of the Circle of Willis were acquired using MRA technique
without intravenous contrast. Angiographic images of the neck were
acquired using MRA technique without and with intravenous contrast.
Carotid stenosis measurements (when applicable) are obtained
utilizing NASCET criteria, using the distal internal carotid
diameter as the denominator.
CONTRAST:  8.5mL GADAVIST GADOBUTROL 1 MMOL/ML IV SOLN

[Series 7: tof_fl3d_tra_iso · axial · B · 0.6mm · 0.52mm/px · z∈[-61,+77]mm · 9 of 257 slices shown]
[im 14/257]
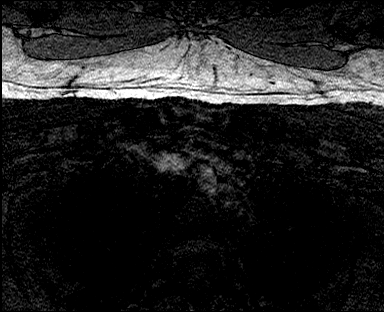
[im 41/257]
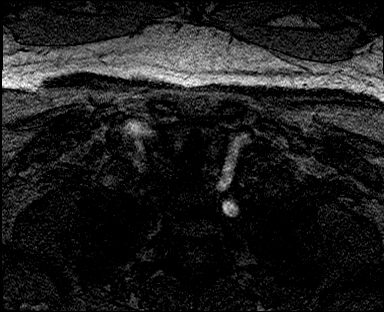
[im 81/257]
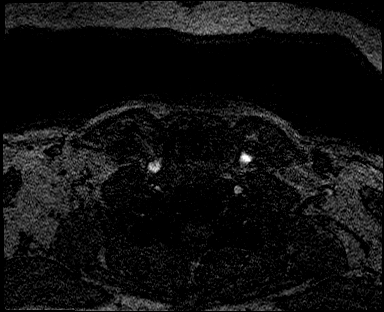
[im 108/257]
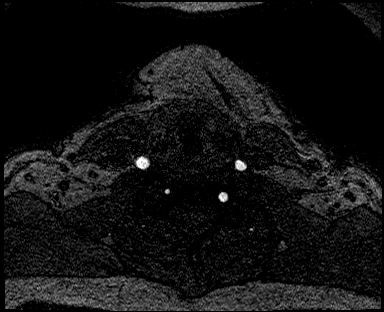
[im 135/257]
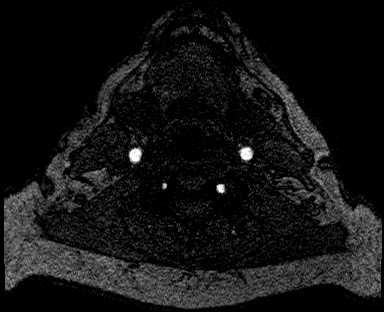
[im 149/257]
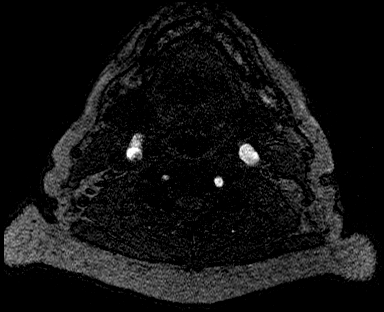
[im 176/257]
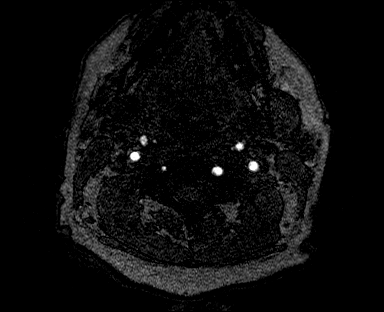
[im 216/257]
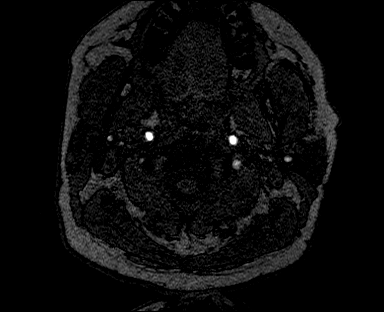
[im 243/257]
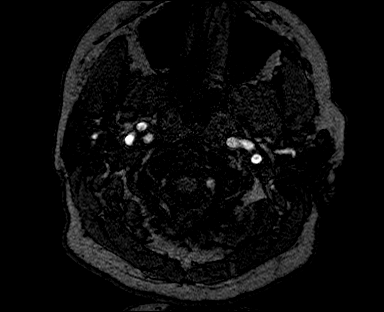

[Series 16: angio_fl3d_cor_highres_pre_ttc=3.0s · coronal · B · 0.9mm · 0.62mm/px · 7 of 80 slices shown]
[im 1/80]
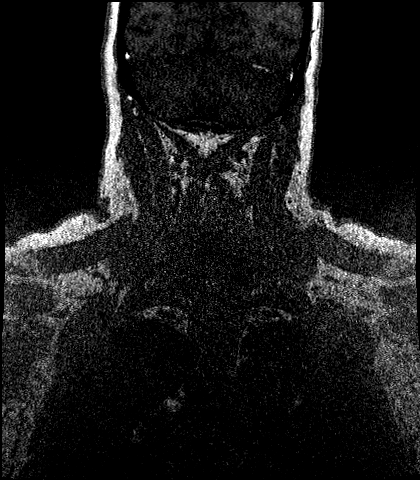
[im 14/80]
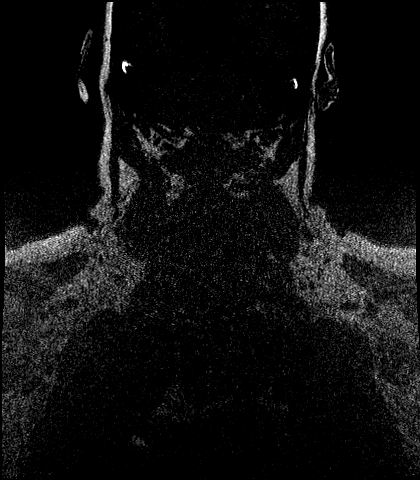
[im 27/80]
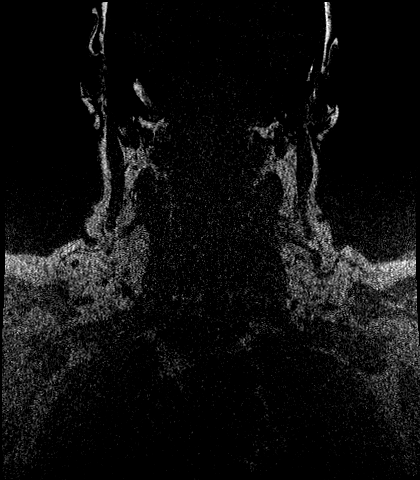
[im 40/80]
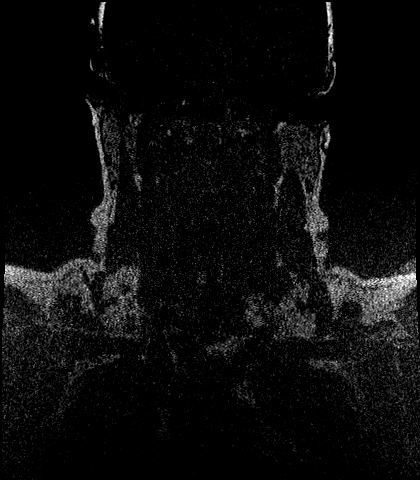
[im 53/80]
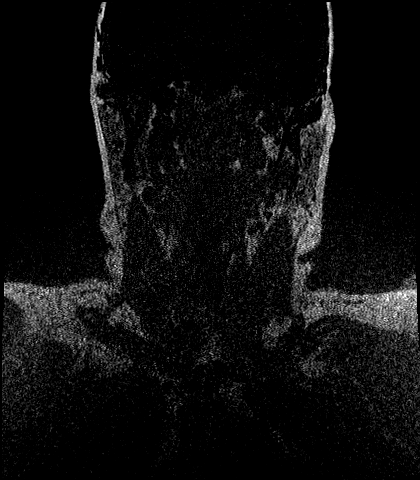
[im 66/80]
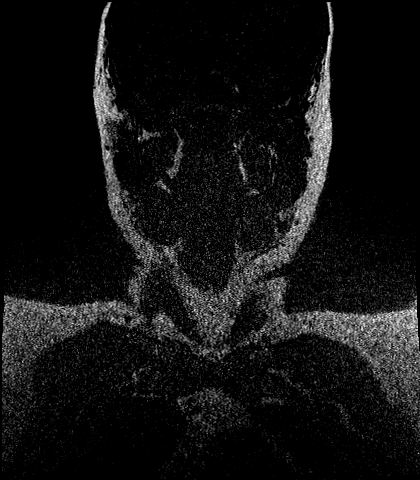
[im 80/80]
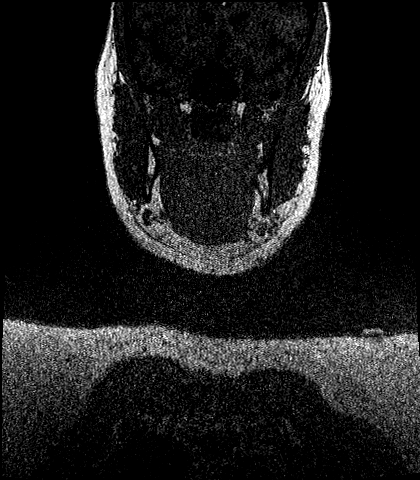

[Series 18: angio_fl3d_cor_highres_post_ttc=3.0s · coronal · B · 0.9mm · 0.62mm/px · 7 of 80 slices shown]
[im 1/80]
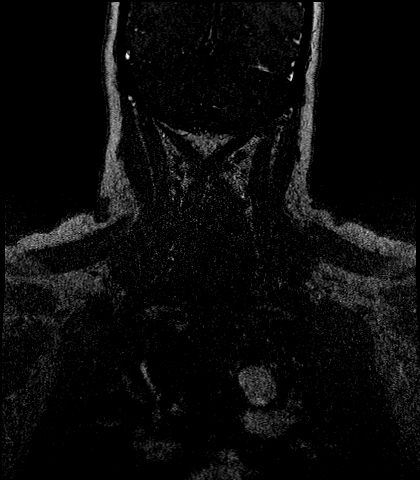
[im 14/80]
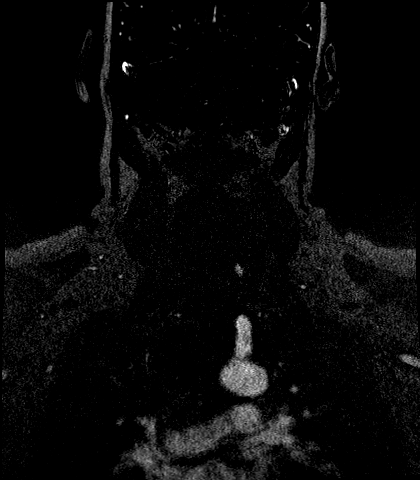
[im 27/80]
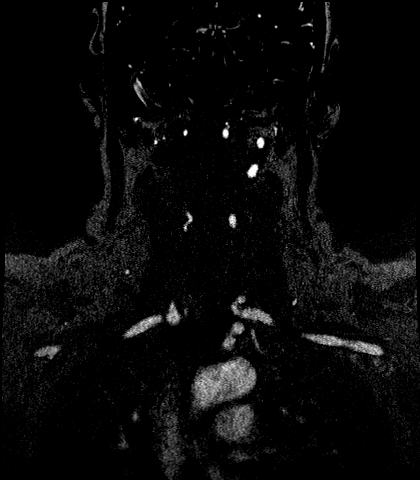
[im 40/80]
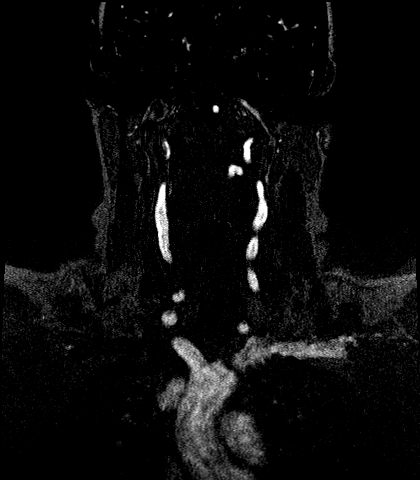
[im 53/80]
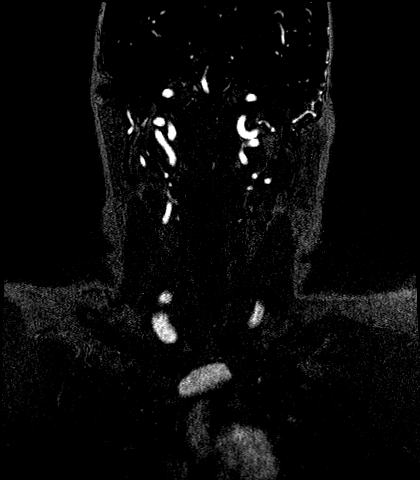
[im 66/80]
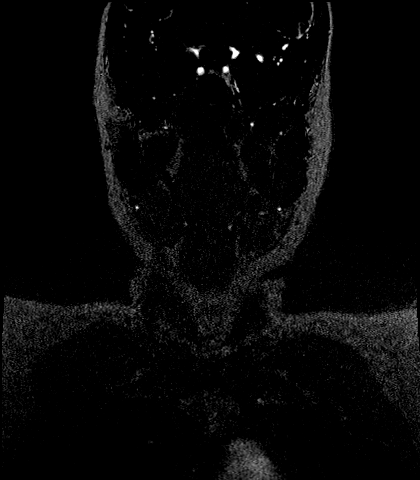
[im 80/80]
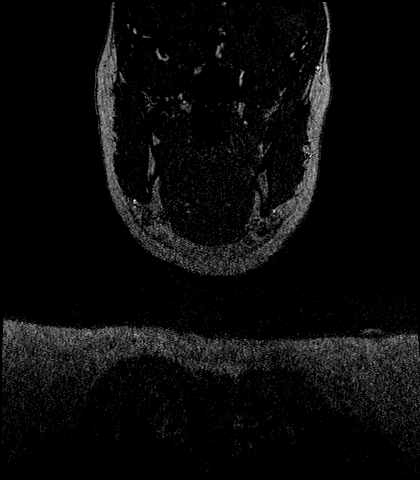

[Series 19: angio_fl3d_cor_highres_post_ttc=3.0s_moco-adv · coronal · B · 0.9mm · 0.62mm/px · 4 of 80 slices shown]
[im 1/80]
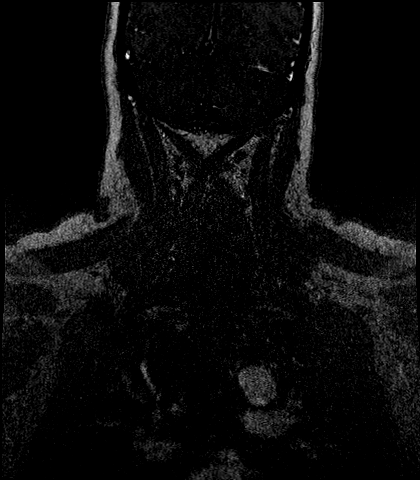
[im 14/80]
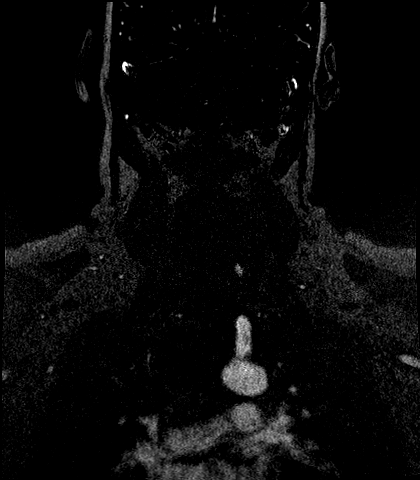
[im 40/80]
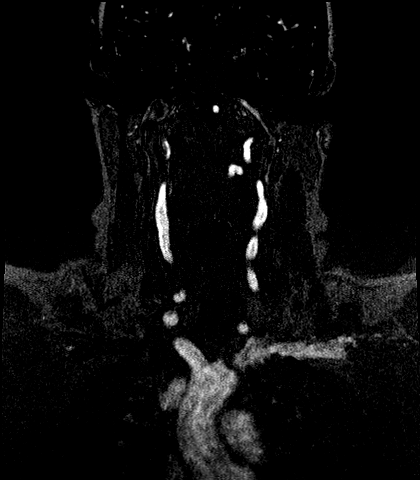
[im 66/80]
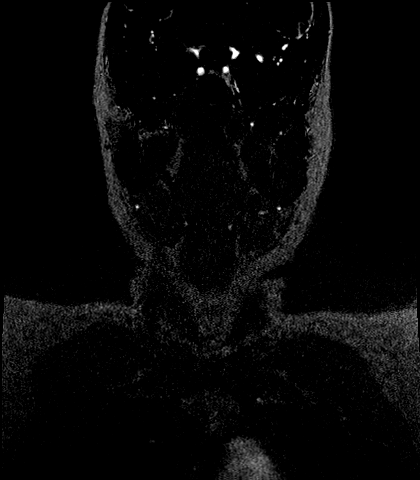

[27 of 48 positions shown; findings below may reference images not displayed]

FINDINGS: MRI HEAD FINDINGS

Brain: No restricted diffusion to suggest acute or subacute infarct.
No acute hemorrhage, mass, mass effect, or midline shift. No
hydrocephalus or extra-axial collection.

Vascular: Please see MRA findings below.

Skull and upper cervical spine: Normal marrow signal.

Sinuses/Orbits: No acute finding.

Other: None.

MRA HEAD FINDINGS

Evaluation is somewhat limited by motion artifact.

Anterior circulation: Both internal carotid arteries are patent to
the termini, without significant stenosis.

A1 segments patent. Normal anterior communicating artery. Anterior
cerebral arteries are patent to their distal aspects.

No M1 stenosis or occlusion. Normal MCA bifurcations. Distal MCA
branches perfused and symmetric.

Posterior circulation:

Vertebral arteries patent to the vertebrobasilar junction without
stenosis, although the non dominant right vertebral artery primarily
supplies PICA. Posterior inferior cerebral arteries patent
bilaterally.

Basilar patent to its distal aspect. Superior cerebellar arteries
patent bilaterally.

Patent P1 segments. PCAs perfused to their distal aspects without
stenosis. The bilateral posterior communicating arteries are not
visualized.

Anatomic variants: None significant

MRA NECK FINDINGS

Aortic arch: Standard aortic branching. No evidence of aneurysm or
dissection.

Right carotid system: No evidence of dissection, occlusion, or
hemodynamically significant stenosis (greater than 50%).

Left carotid system: No evidence of dissection, occlusion, or
hemodynamically significant stenosis (greater than 50%).

Vertebral arteries: Left dominant. No evidence of dissection,
occlusion, or hemodynamically significant stenosis (greater than
50%).

Other: None
IMPRESSION: 1.  No acute intracranial process.
2.  No intracranial large vessel occlusion or significant stenosis.
3.  No hemodynamically significant stenosis in the neck.

## 2021-08-15 IMAGING — MR MR HEAD W/O CM
10 of 11 series · 43 of 48 positions shown · IV contrast (gadavist)
Comparison: No prior MRI, correlation is made with CT head
[DATE]

CLINICAL DATA: Stroke-like symptoms

EXAM:
MRI HEAD WITHOUT CONTRAST
MRA HEAD WITHOUT CONTRAST
MRA NECK WITHOUT AND WITH CONTRAST
TECHNIQUE: Multiplanar, multi-echo pulse sequences of the brain and surrounding
structures were acquired without intravenous contrast. Angiographic
images of the Circle of Willis were acquired using MRA technique
without intravenous contrast. Angiographic images of the neck were
acquired using MRA technique without and with intravenous contrast.
Carotid stenosis measurements (when applicable) are obtained
utilizing NASCET criteria, using the distal internal carotid
diameter as the denominator.
CONTRAST:  8.5mL GADAVIST GADOBUTROL 1 MMOL/ML IV SOLN

[Series 5: DWI · axial · 3.0mm · 0.88mm/px · z∈[-83,+63]mm · 10 of 100 slices shown (1 of 4)]
[im 1/100]
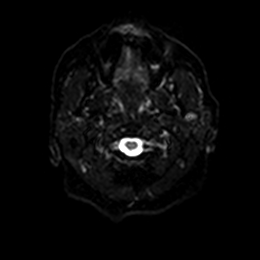
[im 12/100]
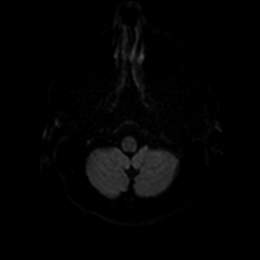
[im 23/100]
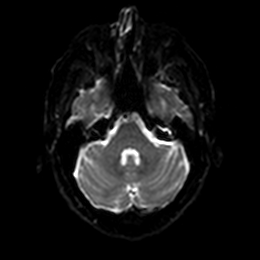
[im 34/100]
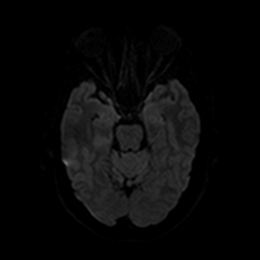
[im 45/100]
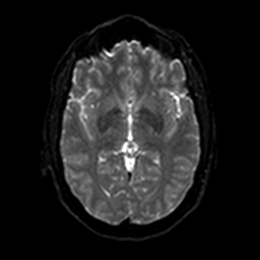
[im 56/100]
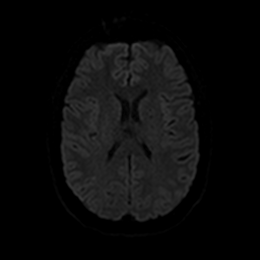
[im 67/100]
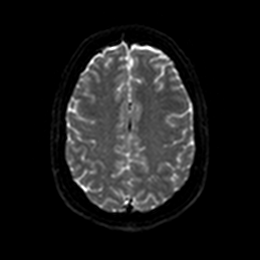
[im 78/100]
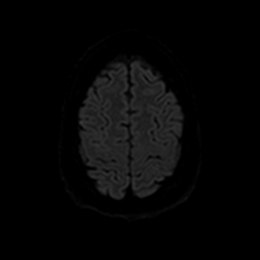
[im 89/100]
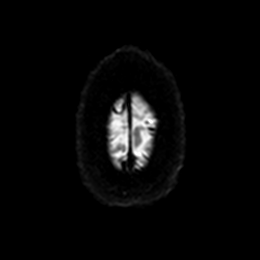
[im 100/100]
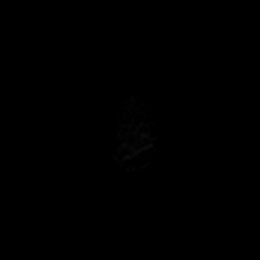

[Series 6: DWI · axial · 3.0mm · 0.88mm/px · z∈[-83,+63]mm · 5 of 50 slices shown (2 of 4)]
[im 1/50]
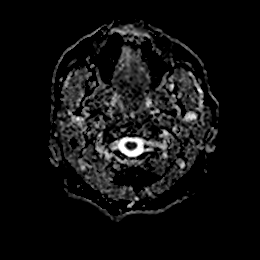
[im 13/50]
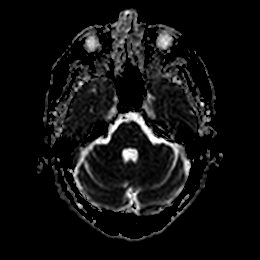
[im 25/50]
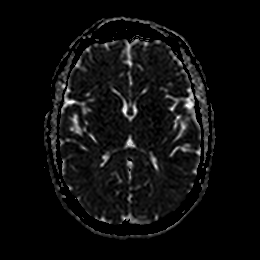
[im 37/50]
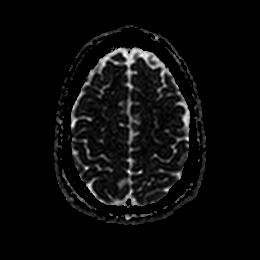
[im 50/50]
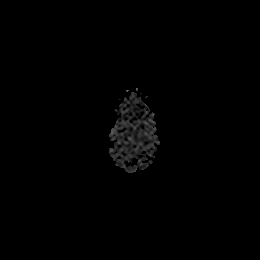

[Series 7: DWI · coronal · 4.0mm · 0.88mm/px · 6 of 64 slices shown (3 of 4)]
[im 1/64]
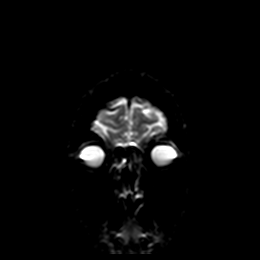
[im 13/64]
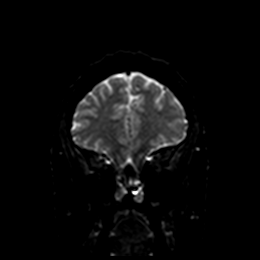
[im 26/64]
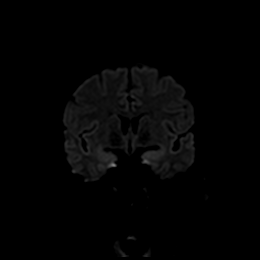
[im 38/64]
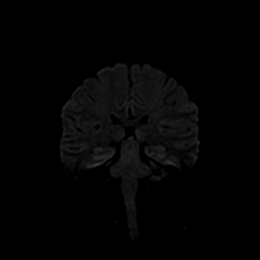
[im 51/64]
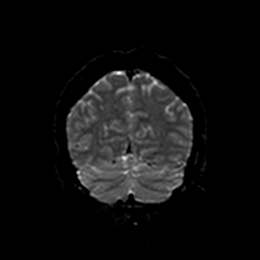
[im 64/64]
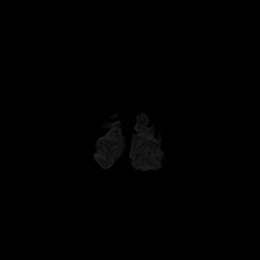

[Series 8: DWI · coronal · 4.0mm · 0.88mm/px · 3 of 32 slices shown (4 of 4)]
[im 1/32]
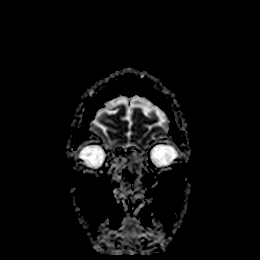
[im 16/32]
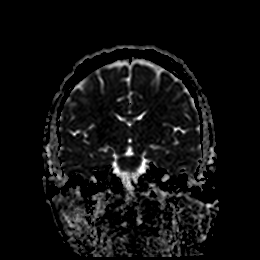
[im 32/32]
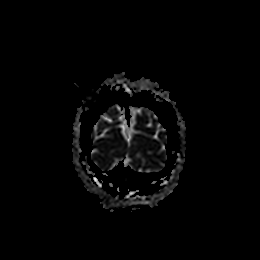

[Series 13: T1 · sagittal · 5.0mm · 0.75mm/px · 2 of 23 slices shown]
[im 1/23]
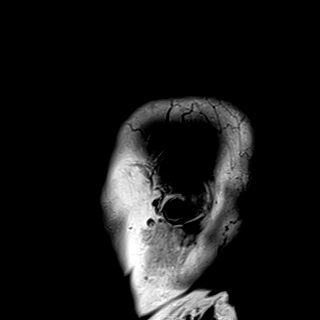
[im 23/23]
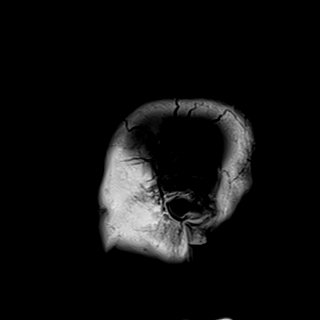

[Series 14: T2 · axial · 5.0mm · 0.72mm/px · z∈[-82,+62]mm · 2 of 25 slices shown (1 of 2)]
[im 1/25]
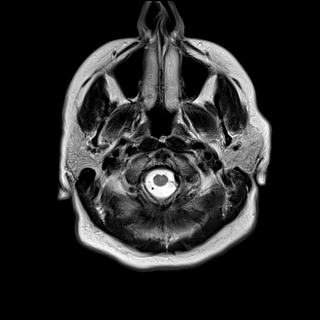
[im 25/25]
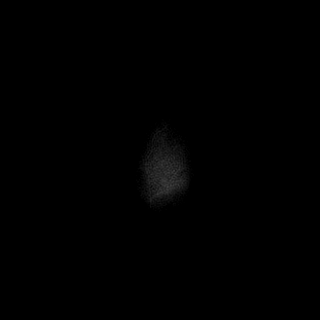

[Series 15: FLAIR · axial · 5.0mm · 0.45mm/px · z∈[-84,+60]mm · 2 of 25 slices shown]
[im 1/25]
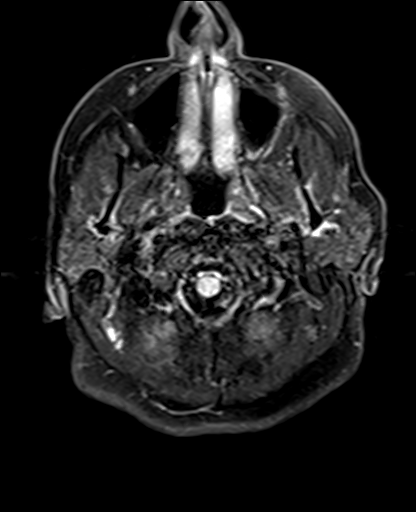
[im 25/25]
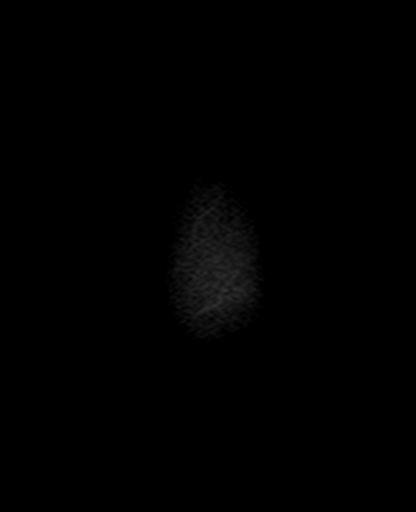

[Series 17: pha_images · axial · 3.0mm · 0.90mm/px · z∈[-100,+73]mm · 5 of 58 slices shown]
[im 1/58]
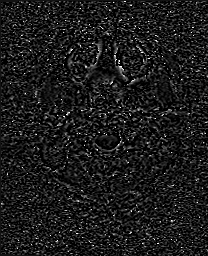
[im 15/58]
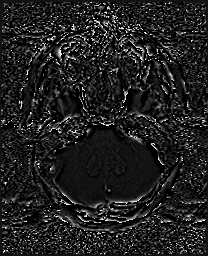
[im 29/58]
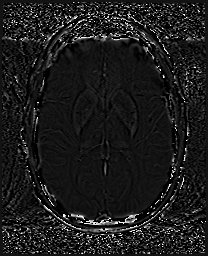
[im 43/58]
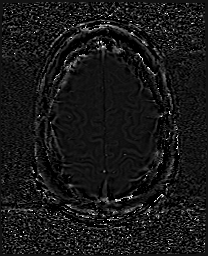
[im 58/58]
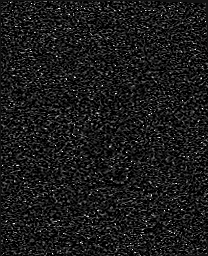

[Series 18: swi_images · axial · 3.0mm · 0.90mm/px · z∈[-100,+76]mm · 5 of 60 slices shown]
[im 1/60]
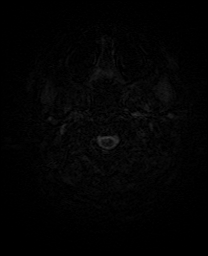
[im 15/60]
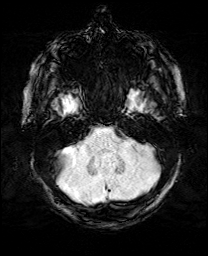
[im 30/60]
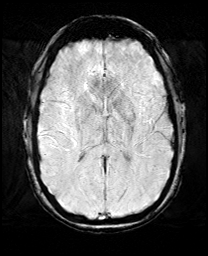
[im 45/60]
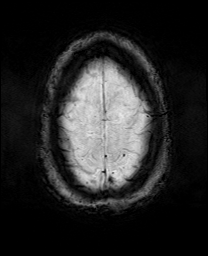
[im 60/60]
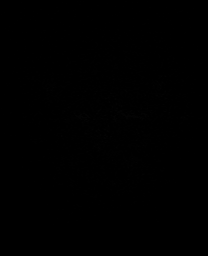

[Series 21: T2 · coronal · 5.0mm · 0.34mm/px · 3 of 29 slices shown (2 of 2)]
[im 1/29]
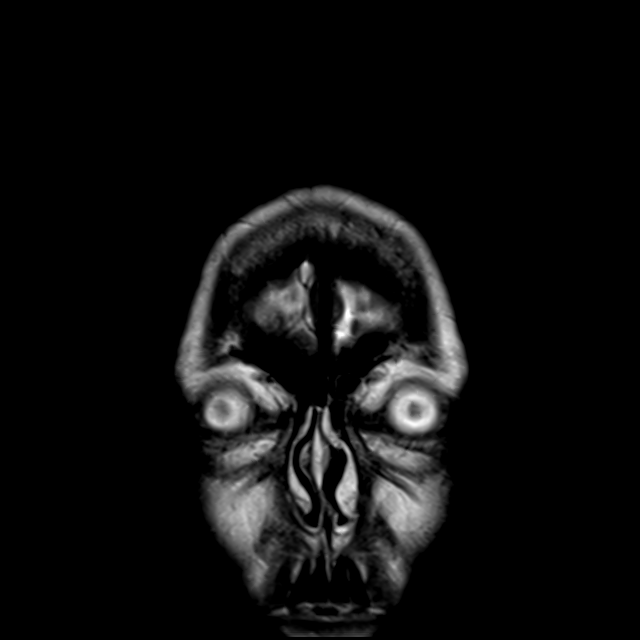
[im 15/29]
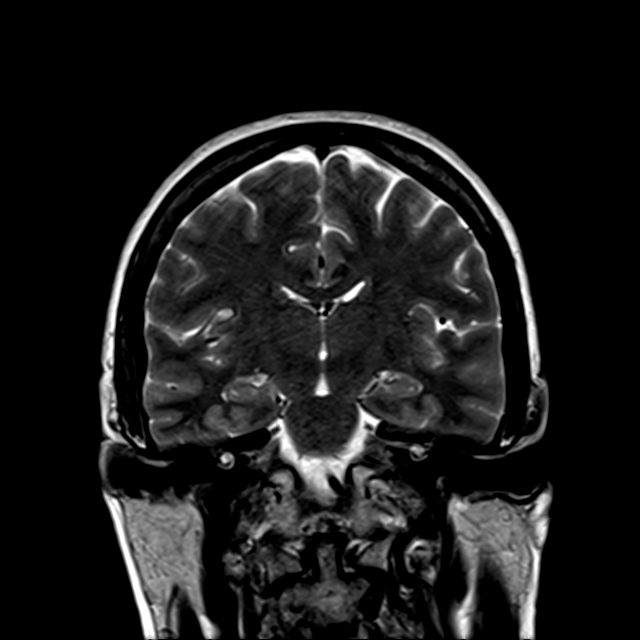
[im 29/29]
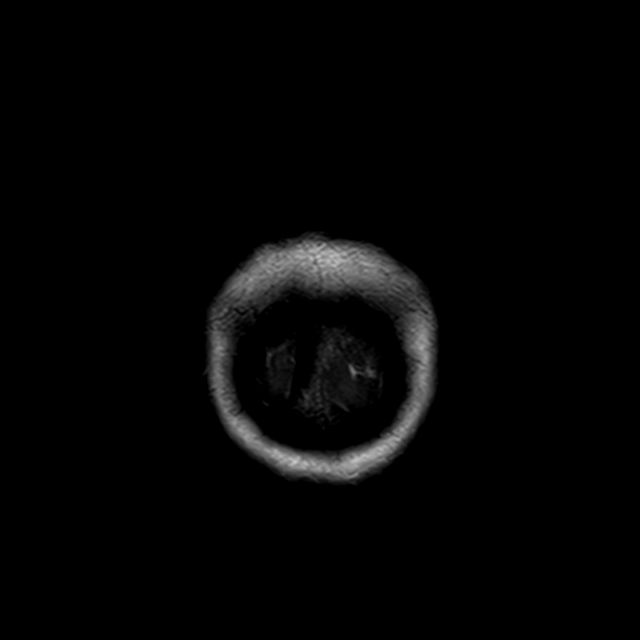

[43 of 48 positions shown; findings below may reference images not displayed]

FINDINGS: MRI HEAD FINDINGS

Brain: No restricted diffusion to suggest acute or subacute infarct.
No acute hemorrhage, mass, mass effect, or midline shift. No
hydrocephalus or extra-axial collection.

Vascular: Please see MRA findings below.

Skull and upper cervical spine: Normal marrow signal.

Sinuses/Orbits: No acute finding.

Other: None.

MRA HEAD FINDINGS

Evaluation is somewhat limited by motion artifact.

Anterior circulation: Both internal carotid arteries are patent to
the termini, without significant stenosis.

A1 segments patent. Normal anterior communicating artery. Anterior
cerebral arteries are patent to their distal aspects.

No M1 stenosis or occlusion. Normal MCA bifurcations. Distal MCA
branches perfused and symmetric.

Posterior circulation:

Vertebral arteries patent to the vertebrobasilar junction without
stenosis, although the non dominant right vertebral artery primarily
supplies PICA. Posterior inferior cerebral arteries patent
bilaterally.

Basilar patent to its distal aspect. Superior cerebellar arteries
patent bilaterally.

Patent P1 segments. PCAs perfused to their distal aspects without
stenosis. The bilateral posterior communicating arteries are not
visualized.

Anatomic variants: None significant

MRA NECK FINDINGS

Aortic arch: Standard aortic branching. No evidence of aneurysm or
dissection.

Right carotid system: No evidence of dissection, occlusion, or
hemodynamically significant stenosis (greater than 50%).

Left carotid system: No evidence of dissection, occlusion, or
hemodynamically significant stenosis (greater than 50%).

Vertebral arteries: Left dominant. No evidence of dissection,
occlusion, or hemodynamically significant stenosis (greater than
50%).

Other: None
IMPRESSION: 1.  No acute intracranial process.
2.  No intracranial large vessel occlusion or significant stenosis.
3.  No hemodynamically significant stenosis in the neck.

## 2021-08-15 MED ORDER — GADOBUTROL 1 MMOL/ML IV SOLN
8.5000 mL | Freq: Once | INTRAVENOUS | Status: AC | PRN
  Administered 2021-08-15: 8.5 mL via INTRAVENOUS

## 2021-08-15 MED ORDER — ATORVASTATIN CALCIUM 40 MG PO TABS: 40.0000 mg | ORAL_TABLET | Freq: Every day | ORAL | 1 refills | Status: AC

## 2021-08-15 MED ORDER — ASPIRIN 81 MG PO TBEC: 81.0000 mg | DELAYED_RELEASE_TABLET | Freq: Every day | ORAL | 1 refills | Status: AC

## 2021-08-15 NOTE — ED Notes (Signed)
Patient transported to MRI 

## 2021-08-15 NOTE — ED Provider Notes (Signed)
I assumed care of this patient.  Please see previous provider note for further details of Hx, PE.  Briefly patient is a 56 y.o. female who [place].  She presented there for left facial droop, aphasia, and ataxia.  Code stroke was activated.  Since symptoms have resolved TNK was not given.  Sent here for MRI/MRA.  On my evaluation.  Patient has no focal deficits.  MRI/MRA without acute stroke or occlusions.  The patient appears reasonably screened and/or stabilized for discharge and I doubt any other medical condition or other [facility] requiring further screening, evaluation, or treatment in the ED at this time prior to discharge. Safe for discharge with strict return precautions.  Disposition: Discharge  Condition: Good  I have discussed the results, Dx and Tx plan with the patient/family who expressed understanding and agree(s) with the plan. Discharge instructions discussed at length. The patient/family was given strict return precautions who verbalized understanding of the instructions. No further questions at time of discharge.    ED Discharge Orders          Ordered    atorvastatin (LIPITOR) 40 MG tablet  Daily        08/15/21 0356    aspirin EC (ASPIRIN ADULT LOW DOSE) 81 MG tablet  Daily        08/15/21 0356             Follow Up: Primary care provider  Call  to schedule an appointment for close follow up  [facility] [place] [phone] Call  to schedule an appointment for close follow up          Greenley Martone, Grayce Sessions, MD 08/15/21 [phone]

## 2021-08-15 NOTE — ED Notes (Signed)
Pt returned from MRI °

## 2021-08-20 DIAGNOSIS — G459 Transient cerebral ischemic attack, unspecified: Secondary | ICD-10-CM | POA: Insufficient documentation

## 2021-09-02 ENCOUNTER — Telehealth: Payer: Self-pay

## 2021-09-02 NOTE — Telephone Encounter (Signed)
NOTES SCANNED TO REFERRAL 

## 2021-09-09 DIAGNOSIS — H9203 Otalgia, bilateral: Secondary | ICD-10-CM | POA: Insufficient documentation

## 2021-09-09 DIAGNOSIS — J342 Deviated nasal septum: Secondary | ICD-10-CM | POA: Insufficient documentation

## 2021-09-10 ENCOUNTER — Ambulatory Visit: Payer: BC Managed Care – PPO | Admitting: Neurology

## 2021-09-13 ENCOUNTER — Ambulatory Visit (INDEPENDENT_AMBULATORY_CARE_PROVIDER_SITE_OTHER): Payer: BC Managed Care – PPO | Admitting: Neurology

## 2021-09-13 ENCOUNTER — Encounter: Payer: Self-pay | Admitting: Neurology

## 2021-09-13 VITALS — BP 129/83 | HR 81 | Ht 62.0 in | Wt 180.5 lb

## 2021-09-13 DIAGNOSIS — C4401 Basal cell carcinoma of skin of lip: Secondary | ICD-10-CM | POA: Insufficient documentation

## 2021-09-13 DIAGNOSIS — Z85828 Personal history of other malignant neoplasm of skin: Secondary | ICD-10-CM | POA: Insufficient documentation

## 2021-09-13 DIAGNOSIS — G459 Transient cerebral ischemic attack, unspecified: Secondary | ICD-10-CM

## 2021-09-13 DIAGNOSIS — Z809 Family history of malignant neoplasm, unspecified: Secondary | ICD-10-CM | POA: Insufficient documentation

## 2021-09-13 DIAGNOSIS — L579 Skin changes due to chronic exposure to nonionizing radiation, unspecified: Secondary | ICD-10-CM | POA: Insufficient documentation

## 2021-09-15 ENCOUNTER — Ambulatory Visit (INDEPENDENT_AMBULATORY_CARE_PROVIDER_SITE_OTHER): Payer: BC Managed Care – PPO | Admitting: Neurology

## 2021-09-15 DIAGNOSIS — G459 Transient cerebral ischemic attack, unspecified: Secondary | ICD-10-CM

## 2021-09-15 DIAGNOSIS — R4182 Altered mental status, unspecified: Secondary | ICD-10-CM | POA: Diagnosis not present

## 2021-09-15 NOTE — Procedures (Signed)
    History:  56 year old woman with altered mental status   EEG classification:  Awake and asleep  Description of the recording: The background rhythms of this recording consists of a fairly well modulated medium amplitude background activity of 10-11 Hz. As the record progresses, the patient initially is in the waking state, but appears to enter the early stage II sleep during the recording, with rudimentary sleep spindles and vertex sharp wave activity seen. During the wakeful state, photic stimulation is performed, and no abnormal responses were seen. Hyperventilation was also performed, no abnormal response seen. No epileptiform discharges seen during this recording. There was no focal slowing. EKG monitor shows no evidence of cardiac rhythm abnormalities with a heart rate of 90.  Abnormality: None   Impression: This is a normal EEG recording in the waking and sleeping state. No evidence interictal epileptiform discharges were seen at any time during the recording.  A normal EEG does not exclude a diagnosis of epilepsy.    Alric Ran, MD [facility]

## 2021-09-29 ENCOUNTER — Encounter (HOSPITAL_BASED_OUTPATIENT_CLINIC_OR_DEPARTMENT_OTHER): Payer: Self-pay | Admitting: Cardiology

## 2021-09-29 ENCOUNTER — Ambulatory Visit (INDEPENDENT_AMBULATORY_CARE_PROVIDER_SITE_OTHER): Payer: BC Managed Care – PPO | Admitting: Cardiology

## 2021-09-29 VITALS — BP 120/84 | HR 69 | Ht 62.0 in | Wt 179.2 lb

## 2021-09-29 DIAGNOSIS — E782 Mixed hyperlipidemia: Secondary | ICD-10-CM

## 2021-09-29 DIAGNOSIS — G459 Transient cerebral ischemic attack, unspecified: Secondary | ICD-10-CM | POA: Diagnosis not present

## 2021-09-29 DIAGNOSIS — Z7189 Other specified counseling: Secondary | ICD-10-CM

## 2021-09-29 DIAGNOSIS — Z8249 Family history of ischemic heart disease and other diseases of the circulatory system: Secondary | ICD-10-CM | POA: Diagnosis not present

## 2021-09-29 NOTE — Patient Instructions (Addendum)
Medication Instructions:  Your Physician recommend you continue on your current medication as directed.    *If you need a refill on your cardiac medications before your next appointment, please call your pharmacy*   Lab Work: Your provider has recommended lab work in August, 2023 (Fasting lipid). Please have this collected at [facility]. The lab is open 8:00 am - 4:30 pm. Please avoid 12:00p - 1:00p for lunch hour. You do not need an appointment. Please go to [address] [place], [place] [address]. This is in the Primary Care office on the 3rd floor, let them know you are there for blood work and they will direct you to the lab.    Testing/Procedures: Your physician has requested that you have an echocardiogram. Echocardiography is a painless test that uses sound waves to create images of your heart. It provides your doctor with information about the size and shape of your heart and how well your heart's chambers and valves are working. This procedure takes approximately one hour. There are no restrictions for this procedure. [place], you and your health needs are our priority.  As part of our continuing mission to provide you with exceptional heart care, we have created designated Provider Care Teams.  These Care Teams include your primary Cardiologist (physician) and Advanced Practice Providers (APPs -  Physician Assistants and Nurse Practitioners) who all work together to provide you with the care you need, when you need it.  We recommend signing up for the patient portal called "MyChart".  Sign up information is provided on this After Visit Summary.  MyChart is used to connect with patients for Virtual Visits (Telemedicine).  Patients are able to view lab/test results, encounter notes, upcoming appointments, etc.  Non-urgent messages can be sent to your provider as well.   To learn more about  what you can do with MyChart, go to [email or web address].    Your next appointment:   1 year(s)  The format for your next appointment:   In Person  Provider:   Buford Dresser, MD{

## 2021-09-29 NOTE — Progress Notes (Signed)
Cardiology Office Note:    Date:  09/29/2021   ID:  Willaim Sheng, DOB 11-24-1965, MRN [id number]  PCP:  Laurey Morale, MD  Cardiologist:  Buford Dresser, MD  Referring MD: Laurey Morale, MD   CC:  New patient consultation for TIA.  History of Present Illness:    Kelsey Green is a 56 y.o. female with a hx of asthma, and depression, who is seen as a new consult at the request of Dr. Suella Broad for the evaluation and management of TIA.  On 08/14/2021 she presented to the ED with concerns for left facial droop, aphasia, and ataxia. She also complained of dizziness and double vision. CT head imaging showed no evidence of hemorrhage. Her labs were fairly unremarkable and her ECG showed no acute ischemia. Her symptoms resolved spontaneously so she was not given TNK. She was transferred to [facility] ED for MRI and MRA which showed no acute stroke or occlusions. She was discharged with strict return precautions.  Referral notes from Dr. Suella Broad personally reviewed. At their visit on 08/19/21 her recent hospitalization was reviewed. On 09/01/21 she requested referral to cardiology for further evaluation regarding her probable TIA.  Attempted review of outside records. I can see that her lp(a) is within normal range at 39.2. I can see that echo and holter were ordered, but I cannot see results.  Today she is accompanied by her fiancee.   Cardiovascular risk factors: Prior clinical ASCVD: Probable TIA as noted above. She was started on atorvastatin and ASA 81 mg. She has not been taking the aspirin in the past 2 weeks. She has seen Dr. April Manson in neurology. Comorbid conditions: She endorses borderline hyperlipidemia - On atorvastatin since 08/2021. Denies diabetes - She reports her last A1C was 5.6. Metabolic syndrome/Obesity: BMI 32 Chronic inflammatory conditions: None. Tobacco use history: Never smoker. Family history: Her mother also had TIAs and AAA requiring surgery, later died of a heart attack;  known smoker. Her father had hyperlipidemia, macular degeneration, and trigeminal neuralgia. Her uncle had Crohn's disease.  Prior cardiac testing and/or incidental findings on other testing (ie coronary calcium):   A heart monitor was ordered and completed, she sent the monitor back on 09/22/21. She has not yet completed an echocardiogram. Exercise level: Yesterday she mowed the lawn for 5 hours; [place]. She admits to being sedentary more than she should be. 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 She enjoys quilting. Current diet: Has followed an [place] intermittently. Breakfast around 8 AM, Optavia bar or peanut butter and jelly sandwich. Lunch may be grilled chicken caesar salad. She notes that they order out frequently, but she often orders salads. Every night she eats handfuls of almonds. She does not enjoy fish. She does not consume many dairy products as it causes inflammation. For the past 4 years she has avoided alcohol consumption. Currently she is taking the [place] supplement.  She denies any recurring episodes concerning for TIA. On some days she feels very fatigued and will lie down for a nap.  In the past week or so she has suffered upper respiratory discomfort including a productive cough.  Occasionally she might have PND, but is not certain. This sometimes occurs if she falls asleep while in the car.  She was on HRT for 2 years. This was discontinued by her PCP.  Previously suffered from lightheadedness and dizziness due to mold allergies. This has resolved.  She denies any palpitations,  chest pain, shortness of breath, or peripheral edema. No headaches, syncope, orthopnea, or PND.  Past Medical History:  Diagnosis Date   Asthma    Depression     Past Surgical History:  Procedure Laterality Date   FRACTURE SURGERY      Current Medications: Current Outpatient Medications on File Prior to Visit  Medication Sig   aspirin EC (ASPIRIN  ADULT LOW DOSE) 81 MG tablet Take 1 tablet (81 mg total) by mouth daily. Swallow whole.   atorvastatin (LIPITOR) 40 MG tablet Take 1 tablet (40 mg total) by mouth daily.   buPROPion (WELLBUTRIN SR) 200 MG 12 hr tablet Take 200 mg by mouth daily.   escitalopram (LEXAPRO) 20 MG tablet Take 20 mg by mouth every morning.   fluticasone (FLONASE) 50 MCG/ACT nasal spray Place 2 sprays into both nostrils at bedtime.   ibuprofen (ADVIL) 200 MG tablet Take 400 mg by mouth every 6 (six) hours as needed for headache or moderate pain.   levocetirizine (XYZAL) 5 MG tablet Take 5 mg by mouth daily.   naproxen sodium (ALEVE) 220 MG tablet Take 220 mg by mouth daily as needed (pain/headache).   NP THYROID 30 MG tablet Take 30 mg by mouth every morning.   No current facility-administered medications on file prior to visit.     Allergies:   Patient has no known allergies.   Social History   Tobacco Use   Smoking status: Never   Smokeless tobacco: Never  Vaping Use   Vaping Use: Never used  Substance Use Topics   Alcohol use: Never   Drug use: Never    Family History: family history includes AAA (abdominal aortic aneurysm) in her mother.  ROS:   Please see the history of present illness.  Additional pertinent ROS: Constitutional: Negative for chills, fever, night sweats, unintentional weight loss. Positive for fatigue. HENT: Negative for ear pain and hearing loss.   Eyes: Negative for loss of vision and eye pain.  Respiratory: Negative for wheezing. Positive for productive cough. Cardiovascular: See HPI. Gastrointestinal: Negative for abdominal pain, melena, and hematochezia.  Genitourinary: Negative for dysuria and hematuria.  Musculoskeletal: Negative for falls and myalgias.  Skin: Negative for itching and rash.  Neurological: Negative for focal weakness, focal sensory changes and loss of consciousness.  Endo/Heme/Allergies: Does not bruise/bleed easily.  Positive for seasonal/mold  allergies.   EKGs/Labs/Other Studies Reviewed:    The following studies were reviewed today:  MRA Neck/Head  08/15/2021: MRA HEAD FINDINGS   Evaluation is somewhat limited by motion artifact.   Anterior circulation: Both internal carotid arteries are patent to the termini, without significant stenosis.   A1 segments patent. Normal anterior communicating artery. Anterior cerebral arteries are patent to their distal aspects.   No M1 stenosis or occlusion. Normal MCA bifurcations. Distal MCA branches perfused and symmetric.   Posterior circulation:   Vertebral arteries patent to the vertebrobasilar junction without stenosis, although the non dominant right vertebral artery primarily supplies PICA. Posterior inferior cerebral arteries patent bilaterally.   Basilar patent to its distal aspect. Superior cerebellar arteries patent bilaterally.   Patent P1 segments. PCAs perfused to their distal aspects without stenosis. The bilateral posterior communicating arteries are not visualized.   Anatomic variants: None significant   MRA NECK FINDINGS   Aortic arch: Standard aortic branching. No evidence of aneurysm or dissection.   Right carotid system: No evidence of dissection, occlusion, or hemodynamically significant stenosis (greater than 50%).   Left carotid system: No evidence of  dissection, occlusion, or hemodynamically significant stenosis (greater than 50%).   Vertebral arteries: Left dominant. No evidence of dissection, occlusion, or hemodynamically significant stenosis (greater than 50%).   Other: None   IMPRESSION: 1.  No acute intracranial process. 2.  No intracranial large vessel occlusion or significant stenosis. 3.  No hemodynamically significant stenosis in the neck.  Left LE Venous Doppler  04/03/2018: Summary:  Right: No evidence of common femoral vein obstruction.  Left: There is no evidence of deep vein thrombosis in the lower extremity.  A cystic  structure measuring 1.5 cm high by 3.4 cm wide by 3.7 cm long is  found in the left popliteal fossa.    EKG:  EKG is personally reviewed.   09/29/2021:  NSR at 69 bpm  Recent Labs: 08/14/2021: ALT 12; BUN 16; Creatinine, Ser 0.93; Hemoglobin 12.9; Platelets 307; Potassium 4.3; Sodium 138   Recent Lipid Panel No results found for: "CHOL", "TRIG", "HDL", "CHOLHDL", "VLDL", "LDLCALC", "LDLDIRECT"  Physical Exam:    VS:  BP 120/84 (BP Location: Right Arm, Patient Position: Sitting, Cuff Size: Large)   Pulse 69   Ht '5\' 2"'$  (1.575 m)   Wt 179 lb 3.2 oz (81.3 kg)   BMI 32.78 kg/m     Wt Readings from Last 3 Encounters:  09/29/21 179 lb 3.2 oz (81.3 kg)  09/13/21 180 lb 8 oz (81.9 kg)  08/14/21 183 lb 8 oz (83.2 kg)    GEN: Well nourished, well developed in no acute distress HEENT: Normal, moist mucous membranes NECK: No JVD CARDIAC: regular rhythm, normal S1 and S2, no rubs or gallops. No murmur. VASCULAR: Radial and DP pulses 2+ bilaterally. No carotid bruits RESPIRATORY:  Clear to auscultation without rales, wheezing or rhonchi  ABDOMEN: Soft, non-tender, non-distended MUSCULOSKELETAL:  Ambulates independently SKIN: Warm and dry, no edema NEUROLOGIC:  Alert and oriented x 3. No focal neuro deficits noted. PSYCHIATRIC:  Normal affect    ASSESSMENT:    1. TIA (transient ischemic attack)   2. Mixed hyperlipidemia   3. Family history of heart disease   4. Cardiac risk counseling   5. Counseling on health promotion and disease prevention    PLAN:    History of TIA Family history of heart disease Mixed hyperlipidemia -lp(a) normal at 39 -lipids 08/19/21 (prior to statin): Tchol 218, HDL 45, LDL 130, TG 277 -has been started on atorvastatin 40 mg, due for recheck in 1-2 months, ordered today -discussed pathology of cholesterol, how plaques form, that MI/CVA result commonly from acute plaque rupture and not gradual stenosis. Discussed mechanism of statin to both decrease plaque  accumulation and stabilize plaque that is already present.  -recommended she restart aspirin, discussed the mechanism for this -sent referral to [facility], as she is interested in lifestyle recommendations to improve her CV risk -ordered echocardiogram with bubble study given TIA (has not yet been completed) -I cannot see holter results, but she was told they were normal -reviewed red flag warning signs that need immediate medical attention  Cardiac risk counseling and prevention recommendations: -recommend heart healthy/Mediterranean diet, with whole grains, fruits, vegetable, fish, lean meats, nuts, and olive oil. Limit salt. -recommend moderate walking, 3-5 times/week for 30-50 minutes each session. Aim for at least 150 minutes.week. Goal should be pace of 3 miles/hours, or walking 1.5 miles in 30 minutes -recommend avoidance of tobacco products. Avoid excess alcohol.  Plan for follow up: 1 year or sooner as needed.  Total time of encounter: 75 minutes total time of  encounter, including 64 minutes spent in face-to-face patient care. This time includes coordination of care and counseling regarding above conditions. Remainder of non-face-to-face time involved reviewing chart documents/testing relevant to the patient encounter and documentation in the medical record.  Buford Dresser, MD, PhD, [facility]    Medication Adjustments/Labs and Tests Ordered: Current medicines are reviewed at length with the patient today.  Concerns regarding medicines are outlined above.   Orders Placed This Encounter  Procedures   Lipid panel   Ambulatory referral to [facility]   EKG 12-Lead   ECHOCARDIOGRAM COMPLETE BUBBLE STUDY   No orders of the defined types were placed in this encounter.  Patient Instructions  Medication Instructions:  Your Physician recommend you continue on your current medication as directed.    *If you need a refill on your  cardiac medications before your next appointment, please call your pharmacy*   Lab Work: Your provider has recommended lab work in August, 2023 (Fasting lipid). Please have this collected at [facility]. The lab is open 8:00 am - 4:30 pm. Please avoid 12:00p - 1:00p for lunch hour. You do not need an appointment. Please go to [address] [place], [place] [address]. This is in the Primary Care office on the 3rd floor, let them know you are there for blood work and they will direct you to the lab.    Testing/Procedures: Your physician has requested that you have an echocardiogram. Echocardiography is a painless test that uses sound waves to create images of your heart. It provides your doctor with information about the size and shape of your heart and how well your heart's chambers and valves are working. This procedure takes approximately one hour. There are no restrictions for this procedure. [place], you and your health needs are our priority.  As part of our continuing mission to provide you with exceptional heart care, we have created designated Provider Care Teams.  These Care Teams include your primary Cardiologist (physician) and Advanced Practice Providers (APPs -  Physician Assistants and Nurse Practitioners) who all work together to provide you with the care you need, when you need it.  We recommend signing up for the patient portal called "MyChart".  Sign up information is provided on this After Visit Summary.  MyChart is used to connect with patients for Virtual Visits (Telemedicine).  Patients are able to view lab/test results, encounter notes, upcoming appointments, etc.  Non-urgent messages can be sent to your provider as well.   To learn more about what you can do with MyChart, go to [email or web address].    Your next appointment:   1 year(s)  The format for your next  appointment:   In Person  Provider:   Buford Dresser, MD{          I,Mathew Stumpf,acting as a scribe for Buford Dresser, MD.,have documented all relevant documentation on the behalf of Buford Dresser, MD,as directed by  Buford Dresser, MD while in the presence of Buford Dresser, MD.  I, Buford Dresser, MD, have reviewed all documentation for this visit. The documentation on 10/05/21 for the exam, diagnosis, procedures, and orders are all accurate and complete.   Signed, Buford Dresser, MD PhD 09/29/2021     [place]

## 2021-10-05 ENCOUNTER — Encounter (HOSPITAL_BASED_OUTPATIENT_CLINIC_OR_DEPARTMENT_OTHER): Payer: Self-pay | Admitting: Cardiology

## 2021-10-07 ENCOUNTER — Other Ambulatory Visit (HOSPITAL_BASED_OUTPATIENT_CLINIC_OR_DEPARTMENT_OTHER): Payer: BC Managed Care – PPO

## 2021-10-25 ENCOUNTER — Ambulatory Visit (INDEPENDENT_AMBULATORY_CARE_PROVIDER_SITE_OTHER): Payer: BC Managed Care – PPO

## 2021-10-25 DIAGNOSIS — G459 Transient cerebral ischemic attack, unspecified: Secondary | ICD-10-CM

## 2021-10-25 LAB — ECHOCARDIOGRAM COMPLETE BUBBLE STUDY
AR max vel: 2.61 cm2
AV Area VTI: 2.71 cm2
AV Area mean vel: 2.56 cm2
AV Mean grad: 3 mmHg
AV Peak grad: 4.8 mmHg
Ao pk vel: 1.09 m/s
Area-P 1/2: 2.83 cm2
S' Lateral: 2.43 cm

## 2021-10-27 ENCOUNTER — Other Ambulatory Visit (HOSPITAL_COMMUNITY): Payer: Self-pay | Admitting: Cardiology

## 2021-10-27 DIAGNOSIS — G459 Transient cerebral ischemic attack, unspecified: Secondary | ICD-10-CM

## 2021-10-28 ENCOUNTER — Inpatient Hospital Stay (HOSPITAL_COMMUNITY): Admit: 2021-10-28 | Payer: BC Managed Care – PPO

## 2021-11-02 ENCOUNTER — Ambulatory Visit (HOSPITAL_COMMUNITY)
Admission: RE | Admit: 2021-11-02 | Discharge: 2021-11-02 | Disposition: A | Discharge status: Home / Self Care | Payer: BC Managed Care – PPO | Source: Ambulatory Visit | Attending: Cardiology | Admitting: Cardiology

## 2021-11-02 DIAGNOSIS — G459 Transient cerebral ischemic attack, unspecified: Secondary | ICD-10-CM | POA: Diagnosis not present

## 2022-01-28 ENCOUNTER — Other Ambulatory Visit: Payer: Self-pay

## 2022-01-28 ENCOUNTER — Ambulatory Visit: Payer: BC Managed Care – PPO | Attending: Internal Medicine

## 2022-01-28 DIAGNOSIS — R2681 Unsteadiness on feet: Secondary | ICD-10-CM | POA: Diagnosis present

## 2022-01-28 DIAGNOSIS — R42 Dizziness and giddiness: Secondary | ICD-10-CM | POA: Insufficient documentation

## 2022-01-28 NOTE — Therapy (Signed)
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 09/13/2021   Family history of cancer 09/13/2021   History of malignant neoplasm of skin 09/13/2021   Skin changes due to chronic exposure to nonionizing radiation, unspecified 09/13/2021   Nasal septal deviation 09/09/2021   Otalgia of both ears 09/09/2021   TIA (transient ischemic attack) 08/20/2021   Carotid bruit 10/02/2020   Chronic depression 10/02/2020   Mixed hyperlipidemia 10/02/2020   Neoplasm of soft tissues 10/02/2020   Reactive airway disease 10/02/2020    PCP: Laurey Morale, MD REFERRING PROVIDER: Selinda Michaels, MD  REFERRING DIAG: R42 (ICD-10-CM) - Dizziness and giddiness  THERAPY DIAG:  Dizziness and giddiness  Unsteadiness on feet  ONSET DATE: 22-90 days  Rationale for Evaluation and Treatment: Rehabilitation  SUBJECTIVE:   SUBJECTIVE STATEMENT: Beginning of September working a lot on the floor. Looking down to brush teeth, any elevation change seems to provoke symptoms.  Laying in bed on right side tends to feel spinning sensation, some instances of nausea.  Pt notes that she had previous incident which was corrected with Epley maneuver. Pt notes having accompanying neck tension Pt accompanied by: self  PERTINENT HISTORY: N/A; hx of vertigo  PAIN:  Are you having pain? No  PRECAUTIONS: None  WEIGHT BEARING  RESTRICTIONS: No  FALLS: Has patient fallen in last 6 months? No  LIVING ENVIRONMENT: Lives with: lives with their family Lives in: House/apartment Stairs: No Has following equipment at home: None  PLOF: Independent  PATIENT GOALS: eliminate symptoms  OBJECTIVE:   DIAGNOSTIC FINDINGS: n/a for this episode  COGNITION: Overall cognitive status: Within functional limits for tasks assessed   SENSATION: WFL  EDEMA:  none  MUSCLE TONE:  NT  DTRs:  NT  POSTURE:  No Significant postural limitations  Cervical ROM:    Guarded to extension due to feeling of symptoms being triggered  STRENGTH: NT  Independent in functional mobility  GAIT: Gait pattern: WFL Distance walked:  Assistive device utilized: None Level of assistance: Complete Independence Comments:     PATIENT SURVEYS:  FOTO 43  VESTIBULAR ASSESSMENT:  GENERAL OBSERVATION:    SYMPTOM BEHAVIOR:  Subjective history: Since september  Non-Vestibular symptoms: tinnitus left ear > right ear  Type of dizziness: Spinning/Vertigo  Frequency: intermittent  Duration: brief, episodic  Aggravating factors: Induced by position change: lying supine and rolling to the right and Induced by motion: looking up at the ceiling, bending down to the ground, turning body quickly, and driving, bending over  Relieving factors: head stationary, closing eyes, slow movements, and avoid busy/distracting environments  Progression of symptoms: unchanged  OCULOMOTOR EXAM:  Ocular Alignment: normal  Ocular ROM: No Limitations  Spontaneous Nystagmus: absent  Gaze-Induced Nystagmus:  absent  Smooth Pursuits: intact  Saccades: intact  Convergence/Divergence: 4 cm     VESTIBULAR - OCULAR REFLEX:   Slow VOR: Normal and Comment: some difficulty with vertical as the head extension was about to trigger symptoms  and questionable for saccadic intrusions  VOR Cancellation: Normal  Head-Impulse Test: HIT Right: negative HIT Left:  positive  Dynamic Visual Acuity:  NT, may re-visit at future session   POSITIONAL TESTING: Right Dix-Hallpike: upbeating, right nystagmus Left Dix-Hallpike: no nystagmus Right Roll Test: no nystagmus Left Roll Test: no nystagmus  MOTION SENSITIVITY:  Motion Sensitivity Quotient Intensity: 0 = none, 1 = Lightheaded, 2 = Mild, 3 = Moderate, 4 = Severe, 5 = Vomiting  Intensity  1. Sitting to supine   2. Supine to L side   3. Supine to R side   4. Supine to sitting   5. L Hallpike-Dix   6. Up from L    7. R Hallpike-Dix   8. Up from R    9. Sitting, head tipped to L knee   10. Head up from L knee   11. Sitting, head tipped to R knee   12. Head up from R knee   13. Sitting head turns x5   14.Sitting head nods x5   15. In stance, 180 turn to L    16. In stance, 180 turn to R     OTHOSTATICS: not done  FUNCTIONAL GAIT: Functional gait assessment: NT  M-CTSIB  Condition 1: Firm Surface, EO 30 Sec, Normal Sway  Condition 2: Firm Surface, EC 30 Sec, Normal and Mild Sway  Condition 3: Foam Surface, EO 30 Sec, Mild Sway  Condition 4: Foam Surface, EC 30 Sec, Severe Sway     VESTIBULAR TREATMENT:                                                                                                   DATE: 01/28/22  Canalith Repositioning:  Epley Right: Number of Reps: 2 no nystagmus present on 2nd Dix-Hallpike but subjective report of very brief sensation Gaze Adaptation:  x1 Viewing Vertical:  Position: sitting and Time: 30 sec Habituation:  Other: TBD Other: pt notes that forward bending and head extension trigger symptoms  PATIENT EDUCATION: Education details: assessment findings and post-maneuver activities Person educated: Patient Education method: Explanation Education comprehension: verbalized understanding  HOME EXERCISE PROGRAM: Access Code: [email or web address] URL: [email or web address] Date: 01/28/2022 Prepared by: Sherlyn Lees  Exercises - Seated Gaze  Stabilization with Head Nod  - 1 x daily - 7 x weekly - 3-5 sets - 30 sec hold - Corner Balance Feet Together With Eyes Closed  - 1 x daily - 7 x weekly - 3 sets - 30 sec hold  GOALS: Goals reviewed with patient? Yes  SHORT TERM GOALS: Target date: 02/11/2022   The patient will be independent with HEP for gaze adaptation, habituation, balance, and general mobility. Baseline: Goal status: INITIAL    LONG TERM GOALS: Target date: 03/18/22    Patient will be free of symptoms with forward bending and head extension in order to perform  her typical work/leisure activities without symptom provocation Baseline: bending forward to mix paint, brush teeth, etc cause symptoms Goal status: INITIAL  2.  Demonstrate improved postural stability and balance per mild sway condition 4 M-CTSIB to improve safety with low light conditions and unstable surfaces Baseline: severe x 30 sec Goal status: INITIAL  3.  Demo improved functional activity tolerance per meeting expected outcomes on FOTO survey (62) Baseline: 43 Goal status: INITIAL  4.  Demonstrate improved stability and safety with gait as evidenced by score 28/30 Functional Gait Assessment  Baseline: NT  Goal status: INITIAL  ASSESSMENT:  CLINICAL IMPRESSION: Patient is a 56 y.o. lady who was seen today for physical therapy evaluation and treatment for dizziness and vertigo. Demonstrates a positive right Dix-Hallpike with Right upbeating nystagmus and subjective report of spinning and notes that her positional tolerance has been limited with symptoms intruding into ADL and work/leisure activities.  Pt also demonstrates symptoms consistent with vestibular hypofunction and presents with balance deficits. Pt would benefit from PT services to address deficits and limitations to eliminate/minimize symptoms to restore capabilities to PLOF  OBJECTIVE IMPAIRMENTS: decreased activity tolerance, decreased balance, decreased mobility, decreased ROM, and  dizziness.   ACTIVITY LIMITATIONS: bending, bed mobility, hygiene/grooming, and locomotion level  PARTICIPATION LIMITATIONS: meal prep, cleaning, community activity, occupation, and yard work  PERSONAL FACTORS: Time since onset of injury/illness/exacerbation are also affecting patient's functional outcome.   REHAB POTENTIAL: Excellent  CLINICAL DECISION MAKING: Stable/uncomplicated  EVALUATION COMPLEXITY: Low   PLAN:  PT FREQUENCY: 1-2x/week, plan is  2x/wk x 2 wks, then taper to 1x/wk x 3wks   PT DURATION: 8 weeks  PLANNED INTERVENTIONS: Therapeutic exercises, Therapeutic activity, Neuromuscular re-education, Balance training, Gait training, Patient/Family education, Self Care, Joint mobilization, Stair training, Vestibular training, Canalith repositioning, Aquatic Therapy, Dry Needling, Electrical stimulation, Spinal mobilization, Cryotherapy, Moist heat, and Manual therapy  PLAN FOR NEXT SESSION: Functional Gait Assessment, Positional tests (Right Dix-Hallpike), HEP review, additional VOR/corner balance?   9:17 AM, 01/28/22 M. Sherlyn Lees, PT, DPT Physical Therapist- [place] Office Number: [phone]

## 2022-02-02 NOTE — Therapy (Signed)
OUTPATIENT PHYSICAL THERAPY VESTIBULAR TREATMENT     Patient Name: Kelsey Green MRN: [id number] DOB:1965/08/31, 56 y.o., female Today's Date: 02/03/2022   PT End of Session - 02/03/22 0840     Visit Number 2    Number of Visits 7    Date for PT Re-Evaluation 03/18/22    Authorization Type BCBS    PT Start Time 0801    PT Stop Time 0840    PT Time Calculation (min) 39 min    Activity Tolerance Patient tolerated treatment well    Behavior During Therapy [facility] for tasks assessed/performed              Past Medical History:  Diagnosis Date   Asthma    Depression    Past Surgical History:  Procedure Laterality Date   FRACTURE SURGERY     Patient Active Problem List   Diagnosis Date Noted   Basal cell carcinoma of skin of lip 09/13/2021   Family history of cancer 09/13/2021   History of malignant neoplasm of skin 09/13/2021   Skin changes due to chronic exposure to nonionizing radiation, unspecified 09/13/2021   Nasal septal deviation 09/09/2021   Otalgia of both ears 09/09/2021   TIA (transient ischemic attack) 08/20/2021   Carotid bruit 10/02/2020   Chronic depression 10/02/2020   Mixed hyperlipidemia 10/02/2020   Neoplasm of soft tissues 10/02/2020   Reactive airway disease 10/02/2020    PCP: Laurey Morale, MD REFERRING PROVIDER: Selinda Michaels, MD  REFERRING DIAG: R42 (ICD-10-CM) - Dizziness and giddiness  THERAPY DIAG:  Dizziness and giddiness  Unsteadiness on feet  ONSET DATE: 22-90 days  Rationale for Evaluation and Treatment: Rehabilitation  SUBJECTIVE:   SUBJECTIVE STATEMENT: Reports that on Monday she rolled over and felt something move in her L ear. Felt "off" the rest of the day. Reports bending forward and back as if brushing teeth and quick motions with head. Reports that L ear pain and tinnitus started in January and is ongoing. Saw ENT in June and they ruled out infection. Vertigo started in September. Denies head trauma,  infection/illness, vision changes/double vision. Notes occasional muffled hearing, tinnitus, otalgia.     Pt accompanied by: self  PERTINENT HISTORY: N/A; hx of vertigo  PAIN:  Are you having pain? No  PRECAUTIONS: None  WEIGHT BEARING RESTRICTIONS: No  FALLS: Has patient fallen in last 6 months? No  LIVING ENVIRONMENT: Lives with: lives with their family Lives in: House/apartment Stairs: No Has following equipment at home: None  PLOF: Independent  PATIENT GOALS: eliminate symptoms  OBJECTIVE:     TODAY'S TREATMENT: 02/03/22 Activity Comments  FGA 23/30  R DH C/o L ear pressure; negative  L DH negative  R roll test negative  L roll test negative  Sitting horizontal VOR 30" No dizziness  Standing horizontal VOR 2x30" C/o difficulty focusing at faster speed; no dizziness   Standing vertical VOR 2x30" 2/10 dizziness     romberg EC 30" Mild sway  Standing wide EC + head turns/nods 30"  Good speed and stability; c/o clicking in L ear   Romberg EC + head turns/nods 30"  Mild sway with head nods; c/o L ear itching      [facility] PT Assessment - 02/03/22 0001       Functional Gait  Assessment   Gait assessed  Yes    Gait Level Surface Walks 20 ft in less than 5.5 sec, no assistive devices, good speed, no evidence for imbalance, normal gait pattern, deviates  no more than 6 in outside of the 12 in walkway width.    Change in Gait Speed Able to smoothly change walking speed without loss of balance or gait deviation. Deviate no more than 6 in outside of the 12 in walkway width.    Gait with Horizontal Head Turns Performs head turns smoothly with no change in gait. Deviates no more than 6 in outside 12 in walkway width    Gait with Vertical Head Turns Performs task with moderate change in gait velocity, slows down, deviates 10-15 in outside 12 in walkway width but recovers, can continue to walk.    Gait and Pivot Turn Pivot turns safely in greater than 3 sec and stops with no  loss of balance, or pivot turns safely within 3 sec and stops with mild imbalance, requires small steps to catch balance.    Step Over Obstacle Is able to step over one shoe box (4.5 in total height) without changing gait speed. No evidence of imbalance.    Gait with Narrow Base of Support Ambulates 4-7 steps.    Gait with Eyes Closed Walks 20 ft, uses assistive device, slower speed, mild gait deviations, deviates 6-10 in outside 12 in walkway width. Ambulates 20 ft in less than 9 sec but greater than 7 sec.    Ambulating Backwards Walks 20 ft, no assistive devices, good speed, no evidence for imbalance, normal gait    Steps Alternating feet, no rail.    Total Score 23    FGA comment: c/o dizziness with pivot turn, head nods             HOME EXERCISE PROGRAM Last updated: 02/03/22 Access Code: [email or web address] URL: [email or web address] Date: 02/03/2022 Prepared by: [facility]  Exercises - Standing Gaze Stabilization with Head Rotation  - 1 x daily - 5 x weekly - 2-3 sets - 10 reps - 30  sec hold - Standing Gaze Stabilization with Head Nod  - 1 x daily - 5 x weekly - 2-3 sets - 10 reps - 30  sec hold - Standing with Head Rotation  - 1 x daily - 5 x weekly - 2 sets - 30 sec hold - Standing with Head Nod  - 1 x daily - 5 x weekly - 2 sets - 30 sec hold    PATIENT EDUCATION: Education details: HEP update; edu on vestibular hypofunction sx and tx course Person educated: Patient Education method: Explanation, Demonstration, Tactile cues, Verbal cues, and Handouts Education comprehension: verbalized understanding and returned demonstration   Below measures were taken at time of initial evaluation unless otherwise specified:   DIAGNOSTIC FINDINGS: n/a for this episode  COGNITION: Overall cognitive status: Within functional limits for tasks assessed   SENSATION: WFL  EDEMA:  none  MUSCLE TONE:  NT  DTRs:  NT  POSTURE:  No  Significant postural limitations  Cervical ROM:    Guarded to extension due to feeling of symptoms being triggered  STRENGTH: NT  Independent in functional mobility  GAIT: Gait pattern: WFL Distance walked:  Assistive device utilized: None Level of assistance: Complete Independence Comments:     PATIENT SURVEYS:  FOTO 43  VESTIBULAR ASSESSMENT:  GENERAL OBSERVATION:    SYMPTOM BEHAVIOR:  Subjective history: Since september  Non-Vestibular symptoms: tinnitus left ear > right ear  Type of dizziness: Spinning/Vertigo  Frequency: intermittent  Duration: brief, episodic  Aggravating factors: Induced by position change: lying supine and rolling to the right and  Induced by motion: looking up at the ceiling, bending down to the ground, turning body quickly, and driving, bending over  Relieving factors: head stationary, closing eyes, slow movements, and avoid busy/distracting environments  Progression of symptoms: unchanged  OCULOMOTOR EXAM:  Ocular Alignment: normal  Ocular ROM: No Limitations  Spontaneous Nystagmus: absent  Gaze-Induced Nystagmus: absent  Smooth Pursuits: intact  Saccades: intact  Convergence/Divergence: 4 cm     VESTIBULAR - OCULAR REFLEX:   Slow VOR: Normal and Comment: some difficulty with vertical as the head extension was about to trigger symptoms  and questionable for saccadic intrusions  VOR Cancellation: Normal  Head-Impulse Test: HIT Right: negative HIT Left: positive  Dynamic Visual Acuity:  NT, may re-visit at future session   POSITIONAL TESTING: Right Dix-Hallpike: upbeating, right nystagmus Left Dix-Hallpike: no nystagmus Right Roll Test: no nystagmus Left Roll Test: no nystagmus  MOTION SENSITIVITY:  Motion Sensitivity Quotient Intensity: 0 = none, 1 = Lightheaded, 2 = Mild, 3 = Moderate, 4 = Severe, 5 = Vomiting  Intensity  1. Sitting to supine   2. Supine to L side   3. Supine to R side   4. Supine to sitting   5. L  Hallpike-Dix   6. Up from L    7. R Hallpike-Dix   8. Up from R    9. Sitting, head tipped to L knee   10. Head up from L knee   11. Sitting, head tipped to R knee   12. Head up from R knee   13. Sitting head turns x5   14.Sitting head nods x5   15. In stance, 180 turn to L    16. In stance, 180 turn to R     OTHOSTATICS: not done  FUNCTIONAL GAIT: Functional gait assessment: NT  M-CTSIB  Condition 1: Firm Surface, EO 30 Sec, Normal Sway  Condition 2: Firm Surface, EC 30 Sec, Normal and Mild Sway  Condition 3: Foam Surface, EO 30 Sec, Mild Sway  Condition 4: Foam Surface, EC 30 Sec, Severe Sway     VESTIBULAR TREATMENT:                                                                                                   DATE: 01/28/22  Canalith Repositioning:  Epley Right: Number of Reps: 2 no nystagmus present on 2nd Dix-Hallpike but subjective report of very brief sensation Gaze Adaptation:  x1 Viewing Vertical:  Position: sitting and Time: 30 sec Habituation:  Other: TBD Other: pt notes that forward bending and head extension trigger symptoms  PATIENT EDUCATION: Education details: assessment findings and post-maneuver activities Person educated: Patient Education method: Explanation Education comprehension: verbalized understanding  HOME EXERCISE PROGRAM: Access Code: [email or web address] URL: [email or web address] Date: 01/28/2022 Prepared by: Sherlyn Lees  Exercises - Seated Gaze Stabilization with Head Nod  - 1 x daily - 7 x weekly - 3-5 sets - 30 sec hold - Corner Balance Feet Together With Eyes Closed  - 1 x daily - 7 x weekly - 3 sets - 30 sec hold  GOALS: Goals reviewed with patient? Yes  SHORT TERM GOALS: Target date: 02/11/2022   The patient will be independent with HEP for gaze adaptation, habituation, balance, and general mobility. Baseline: Goal status: IN PROGRESS    LONG TERM GOALS: Target date: 03/18/22    Patient will be free of  symptoms with forward bending and head extension in order to perform her typical work/leisure activities without symptom provocation Baseline: bending forward to mix paint, brush teeth, etc cause symptoms Goal status: IN PROGRESS  2.  Demonstrate improved postural stability and balance per mild sway condition 4 M-CTSIB to improve safety with low light conditions and unstable surfaces Baseline: severe x 30 sec Goal status: IN PROGRESS  3.  Demo improved functional activity tolerance per meeting expected outcomes on FOTO survey (62) Baseline: 43 Goal status: IN PROGRESS  4.  Demonstrate improved stability and safety with gait as evidenced by score 28/30 Functional Gait Assessment  Baseline: 23/30 02/03/22  Goal status: IN PROGRESS 02/03/22  ASSESSMENT:  CLINICAL IMPRESSION: Patient arrived to session with report of an episode of dizziness when rolling in bed on Monday. Reports that L ear pain and tinnitus started in January and is ongoing. Saw an ENT in June and they ruled out infection, then with vertigo starting in September. Denies head trauma, infection/illness, vision changes/double vision. Notes occasional muffled hearing, tinnitus, otalgia. Patient scored 23/30 on FGA, indicating decreased risk of falls. Most imbalance evident with head nods, pivot turns, stepping over obstacle, and tandem walk. Positional testing was negative today. Reviewed and updated HEP to include gaze stabilization and progression of balance activities with EC and head movements. Patient tolerated session well and without complaints upon leaving.   OBJECTIVE IMPAIRMENTS: decreased activity tolerance, decreased balance, decreased mobility, decreased ROM, and dizziness.   ACTIVITY LIMITATIONS: bending, bed mobility, hygiene/grooming, and locomotion level  PARTICIPATION LIMITATIONS: meal prep, cleaning, community activity, occupation, and yard work  PERSONAL FACTORS: Time since onset of injury/illness/exacerbation  are also affecting patient's functional outcome.   REHAB POTENTIAL: Excellent  CLINICAL DECISION MAKING: Stable/uncomplicated  EVALUATION COMPLEXITY: Low   PLAN:  PT FREQUENCY: 1-2x/week, plan is  2x/wk x 2 wks, then taper to 1x/wk x 3wks   PT DURATION: 8 weeks  PLANNED INTERVENTIONS: Therapeutic exercises, Therapeutic activity, Neuromuscular re-education, Balance training, Gait training, Patient/Family education, Self Care, Joint mobilization, Stair training, Vestibular training, Canalith repositioning, Aquatic Therapy, Dry Needling, Electrical stimulation, Spinal mobilization, Cryotherapy, Moist heat, and Manual therapy  PLAN FOR NEXT SESSION: HEP review, additional VOR/corner balance?; try habituation    Janene Harvey, PT, DPT 02/03/22 8:43 AM  [facility] [address], [place], [place] [address] Phone # [phone] Fax # [phone]

## 2022-02-03 ENCOUNTER — Encounter: Payer: Self-pay | Admitting: Physical Therapy

## 2022-02-03 ENCOUNTER — Ambulatory Visit: Payer: BC Managed Care – PPO | Admitting: Physical Therapy

## 2022-02-03 DIAGNOSIS — R42 Dizziness and giddiness: Secondary | ICD-10-CM | POA: Diagnosis not present

## 2022-02-03 DIAGNOSIS — R2681 Unsteadiness on feet: Secondary | ICD-10-CM

## 2022-02-04 NOTE — Therapy (Signed)
OUTPATIENT PHYSICAL THERAPY VESTIBULAR TREATMENT     Patient Name: Kelsey Green MRN: [id number] DOB:03-May-1965, 56 y.o., female Today's Date: 02/07/2022   PT End of Session - 02/07/22 0836     Visit Number 3    Number of Visits 7    Date for PT Re-Evaluation 03/18/22    Authorization Type BCBS    PT Start Time 0759    PT Stop Time 0835    PT Time Calculation (min) 36 min    Equipment Utilized During Treatment Gait belt    Activity Tolerance Patient tolerated treatment well    Behavior During Therapy [facility] for tasks assessed/performed              Past Medical History:  Diagnosis Date   Asthma    Depression    Past Surgical History:  Procedure Laterality Date   FRACTURE SURGERY     Patient Active Problem List   Diagnosis Date Noted   Basal cell carcinoma of skin of lip 09/13/2021   Family history of cancer 09/13/2021   History of malignant neoplasm of skin 09/13/2021   Skin changes due to chronic exposure to nonionizing radiation, unspecified 09/13/2021   Nasal septal deviation 09/09/2021   Otalgia of both ears 09/09/2021   TIA (transient ischemic attack) 08/20/2021   Carotid bruit 10/02/2020   Chronic depression 10/02/2020   Mixed hyperlipidemia 10/02/2020   Neoplasm of soft tissues 10/02/2020   Reactive airway disease 10/02/2020    PCP: Laurey Morale, MD REFERRING PROVIDER: Selinda Michaels, MD  REFERRING DIAG: R42 (ICD-10-CM) - Dizziness and giddiness  THERAPY DIAG:  Dizziness and giddiness  Unsteadiness on feet  ONSET DATE: 22-90 days  Rationale for Evaluation and Treatment: Rehabilitation  SUBJECTIVE:   SUBJECTIVE STATEMENT: Doing good. Denies new issues. Reports that "memory sucks" and believes that it is d/t hormones. Did not do her exercises. Reports that her symptoms are improved.    Pt accompanied by: self  PERTINENT HISTORY: N/A; hx of vertigo  PAIN:  Are you having pain? No  PRECAUTIONS: None  WEIGHT BEARING RESTRICTIONS:  No  FALLS: Has patient fallen in last 6 months? No  LIVING ENVIRONMENT: Lives with: lives with their family Lives in: House/apartment Stairs: No Has following equipment at home: None  PLOF: Independent  PATIENT GOALS: eliminate symptoms  OBJECTIVE:    TODAY'S TREATMENT: 02/07/22 Activity Comments  brandt daroff EO 3x to B sides, EC 1x to B sides  C/o mild dizziness sitting up from B sides; c/o slight worsened dizziness upon laying down to L with EC  standing on foam head turns/nods to targets 2x30" each Cues to speed up to increase challenge; c/o 2/10 wooziness upon speeding up and with mild imbalance   gait + head turns/nods  2x21f Some initial wooziness which dissipated with practice   gait + vertical/horizontal ball throw with visual tracking  2x723f Initially with discontinuous steps which improved with practice   D2 flexion ball to [place] on floor 10x each side   2/10 sensation of "motion"  tandem walk fwd/back along counter x4 each No UE support; mild ankle/core instability     PATIENT EDUCATION: Education details: HEP update Person educated: Patient Education method: Explanation, Demonstration, Tactile cues, Verbal cues, and Handouts Education comprehension: verbalized understanding and returned demonstration    HOME EXERCISE PROGRAM Last updated: 02/07/22 Access Code: [email or web address]: [email or web address] Date: 02/07/2022 Prepared by: [facility]  Exercises - Standing Gaze Stabilization with Head  Rotation  - 1 x daily - 5 x weekly - 2-3 sets - 10 reps - 30  sec hold - Standing Gaze Stabilization with Head Nod  - 1 x daily - 5 x weekly - 2-3 sets - 10 reps - 30  sec hold - Standing with Head Rotation  - 1 x daily - 5 x weekly - 2 sets - 30 sec hold - Standing with Head Nod  - 1 x daily - 5 x weekly - 2 sets - 30 sec hold - Brandt-Daroff Vestibular Exercise  - 1 x daily - 5 x weekly - 2 sets - 3-5  reps     Below measures were taken at time of initial evaluation unless otherwise specified:   DIAGNOSTIC FINDINGS: n/a for this episode  COGNITION: Overall cognitive status: Within functional limits for tasks assessed   SENSATION: WFL  EDEMA:  none  MUSCLE TONE:  NT  DTRs:  NT  POSTURE:  No Significant postural limitations  Cervical ROM:    Guarded to extension due to feeling of symptoms being triggered  STRENGTH: NT  Independent in functional mobility  GAIT: Gait pattern: WFL Distance walked:  Assistive device utilized: None Level of assistance: Complete Independence Comments:     PATIENT SURVEYS:  FOTO 43  VESTIBULAR ASSESSMENT:  GENERAL OBSERVATION:    SYMPTOM BEHAVIOR:  Subjective history: Since september  Non-Vestibular symptoms: tinnitus left ear > right ear  Type of dizziness: Spinning/Vertigo  Frequency: intermittent  Duration: brief, episodic  Aggravating factors: Induced by position change: lying supine and rolling to the right and Induced by motion: looking up at the ceiling, bending down to the ground, turning body quickly, and driving, bending over  Relieving factors: head stationary, closing eyes, slow movements, and avoid busy/distracting environments  Progression of symptoms: unchanged  OCULOMOTOR EXAM:  Ocular Alignment: normal  Ocular ROM: No Limitations  Spontaneous Nystagmus: absent  Gaze-Induced Nystagmus: absent  Smooth Pursuits: intact  Saccades: intact  Convergence/Divergence: 4 cm     VESTIBULAR - OCULAR REFLEX:   Slow VOR: Normal and Comment: some difficulty with vertical as the head extension was about to trigger symptoms  and questionable for saccadic intrusions  VOR Cancellation: Normal  Head-Impulse Test: HIT Right: negative HIT Left: positive  Dynamic Visual Acuity:  NT, may re-visit at future session   POSITIONAL TESTING: Right Dix-Hallpike: upbeating, right nystagmus Left Dix-Hallpike: no  nystagmus Right Roll Test: no nystagmus Left Roll Test: no nystagmus  MOTION SENSITIVITY:  Motion Sensitivity Quotient Intensity: 0 = none, 1 = Lightheaded, 2 = Mild, 3 = Moderate, 4 = Severe, 5 = Vomiting  Intensity  1. Sitting to supine   2. Supine to L side   3. Supine to R side   4. Supine to sitting   5. L Hallpike-Dix   6. Up from L    7. R Hallpike-Dix   8. Up from R    9. Sitting, head tipped to L knee   10. Head up from L knee   11. Sitting, head tipped to R knee   12. Head up from R knee   13. Sitting head turns x5   14.Sitting head nods x5   15. In stance, 180 turn to L    16. In stance, 180 turn to R     OTHOSTATICS: not done  FUNCTIONAL GAIT: Functional gait assessment: NT  M-CTSIB  Condition 1: Firm Surface, EO 30 Sec, Normal Sway  Condition 2: Firm Surface, EC 30 Sec,  Normal and Mild Sway  Condition 3: Foam Surface, EO 30 Sec, Mild Sway  Condition 4: Foam Surface, EC 30 Sec, Severe Sway     VESTIBULAR TREATMENT:                                                                                                   DATE: 01/28/22  Canalith Repositioning:  Epley Right: Number of Reps: 2 no nystagmus present on 2nd Dix-Hallpike but subjective report of very brief sensation Gaze Adaptation:  x1 Viewing Vertical:  Position: sitting and Time: 30 sec Habituation:  Other: TBD Other: pt notes that forward bending and head extension trigger symptoms  PATIENT EDUCATION: Education details: assessment findings and post-maneuver activities Person educated: Patient Education method: Explanation Education comprehension: verbalized understanding  HOME EXERCISE PROGRAM: Access Code: [email or web address] URL: [email or web address] Date: 01/28/2022 Prepared by: Sherlyn Lees  Exercises - Seated Gaze Stabilization with Head Nod  - 1 x daily - 7 x weekly - 3-5 sets - 30 sec hold - Corner Balance Feet Together With Eyes Closed  - 1 x daily - 7 x weekly - 3 sets - 30  sec hold  GOALS: Goals reviewed with patient? Yes  SHORT TERM GOALS: Target date: 02/11/2022   The patient will be independent with HEP for gaze adaptation, habituation, balance, and general mobility. Baseline: Goal status: IN PROGRESS    LONG TERM GOALS: Target date: 03/18/22    Patient will be free of symptoms with forward bending and head extension in order to perform her typical work/leisure activities without symptom provocation Baseline: bending forward to mix paint, brush teeth, etc cause symptoms Goal status: IN PROGRESS  2.  Demonstrate improved postural stability and balance per mild sway condition 4 M-CTSIB to improve safety with low light conditions and unstable surfaces Baseline: severe x 30 sec Goal status: IN PROGRESS  3.  Demo improved functional activity tolerance per meeting expected outcomes on FOTO survey (62) Baseline: 43 Goal status: IN PROGRESS  4.  Demonstrate improved stability and safety with gait as evidenced by score 28/30 Functional Gait Assessment  Baseline: 23/30 02/03/22  Goal status: IN PROGRESS 02/03/22  ASSESSMENT:  CLINICAL IMPRESSION: Patient arrived to session without new complaints. Admits to HEP noncompliance. Worked on habituation activities which brought on mild c/o dizziness. Progressed gaze stabilization to uneven surfaces with onset of tolerable symptoms. Also able to incorporate gait with dual tasking activities to challenge vestibular system further, which revealed some discoordination initially but improved with practice. Patient tolerated good progression of exercises today with good stability and only mild onset of symptoms- may be ready to wrap up and transition to HEP within coming sessions.   OBJECTIVE IMPAIRMENTS: decreased activity tolerance, decreased balance, decreased mobility, decreased ROM, and dizziness.   ACTIVITY LIMITATIONS: bending, bed mobility, hygiene/grooming, and locomotion level  PARTICIPATION LIMITATIONS:  meal prep, cleaning, community activity, occupation, and yard work  PERSONAL FACTORS: Time since onset of injury/illness/exacerbation are also affecting patient's functional outcome.   REHAB POTENTIAL: Excellent  CLINICAL DECISION MAKING: Stable/uncomplicated  EVALUATION COMPLEXITY: Low   PLAN:  PT FREQUENCY: 1-2x/week, plan  is  2x/wk x 2 wks, then taper to 1x/wk x 3wks   PT DURATION: 8 weeks  PLANNED INTERVENTIONS: Therapeutic exercises, Therapeutic activity, Neuromuscular re-education, Balance training, Gait training, Patient/Family education, Self Care, Joint mobilization, Stair training, Vestibular training, Canalith repositioning, Aquatic Therapy, Dry Needling, Electrical stimulation, Spinal mobilization, Cryotherapy, Moist heat, and Manual therapy  PLAN FOR NEXT SESSION: reassess goals and possible DC/30 day hold; HEP review, additional VOR/corner balance   Janene Harvey, PT, DPT 02/07/22 8:38 AM  [facility] [address], [place], [place] [address] Phone # [phone] Fax # [phone]

## 2022-02-07 ENCOUNTER — Encounter: Payer: Self-pay | Admitting: Physical Therapy

## 2022-02-07 ENCOUNTER — Ambulatory Visit: Payer: BC Managed Care – PPO | Admitting: Physical Therapy

## 2022-02-07 DIAGNOSIS — R42 Dizziness and giddiness: Secondary | ICD-10-CM

## 2022-02-07 DIAGNOSIS — R2681 Unsteadiness on feet: Secondary | ICD-10-CM

## 2022-02-09 ENCOUNTER — Ambulatory Visit: Payer: BC Managed Care – PPO

## 2022-02-09 DIAGNOSIS — R42 Dizziness and giddiness: Secondary | ICD-10-CM

## 2022-02-09 DIAGNOSIS — R2681 Unsteadiness on feet: Secondary | ICD-10-CM

## 2022-02-09 NOTE — Therapy (Signed)
OUTPATIENT PHYSICAL THERAPY VESTIBULAR TREATMENT     Patient Name: Kelsey Green MRN: [id number] DOB:July 10, 1965, 56 y.o., female Today's Date: 02/09/2022   PT End of Session - 02/09/22 0755     Visit Number 4    Number of Visits 7    Date for PT Re-Evaluation 03/18/22    Authorization Type BC[employer]    PT Start Time 0800    PT Stop Time 0845    PT Time Calculation (min) 45 min    Equipment Utilized During Treatment Gait belt    Activity Tolerance Patient tolerated treatment well    Behavior During Therapy WFL for tasks assessed/performed              Past Medical History:  Diagnosis Date   Asthma    Depression    Past Surgical History:  Procedure Laterality Date   FRACTURE SURGERY     Patient Active Problem List   Diagnosis Date Noted   Basal cell carcinoma of skin of lip 09/13/2021   Family history of cancer 09/13/2021   History of malignant neoplasm of skin 09/13/2021   Skin changes due to chronic exposure to nonionizing radiation, unspecified 09/13/2021   Nasal septal deviation 09/09/2021   Otalgia of both ears 09/09/2021   TIA (transient ischemic attack) 08/20/2021   Carotid bruit 10/02/2020   Chronic depression 10/02/2020   Mixed hyperlipidemia 10/02/2020   Neoplasm of soft tissues 10/02/2020   Reactive airway disease 10/02/2020    PCP: Laurey Morale, MD REFERRING PROVIDER: Selinda Michaels, MD  REFERRING DIAG: R42 (ICD-10-CM) - Dizziness and giddiness  THERAPY DIAG:  Dizziness and giddiness  Unsteadiness on feet  ONSET DATE: 22-90 days  Rationale for Evaluation and Treatment: Rehabilitation  SUBJECTIVE:   SUBJECTIVE STATEMENT:  Feeling better overall,, noticing when laying on left side there's a dull sensation/otalgia left in the ear and almost always tinnitus   Pt accompanied by: self  PERTINENT HISTORY: N/A; hx of vertigo  PAIN:  Are you having pain? No  PRECAUTIONS: None  WEIGHT BEARING RESTRICTIONS: No  FALLS: Has patient  fallen in last 6 months? No  LIVING ENVIRONMENT: Lives with: lives with their family Lives in: House/apartment Stairs: No Has following equipment at home: None  PLOF: Independent  PATIENT GOALS: eliminate symptoms  OBJECTIVE:   TODAY'S TREATMENT: 02/09/22 Activity Comments  VOR x 1 horizontal/vertical 90 bpm, Eye chart as background  gait + head turns/nods  2x55f Fixating on target on wall  Brandt-Daroff No symptoms  Corner balance -Feet together: EO/EC w/ head turns -tandem: EO/EC head turns  M-CTSIB Condition 3: normal Condition 4: mild-mod        TODAY'S TREATMENT: 02/07/22 Activity Comments  brandt daroff EO 3x to B sides, EC 1x to B sides  C/o mild dizziness sitting up from B sides; c/o slight worsened dizziness upon laying down to L with EC  standing on foam head turns/nods to targets 2x30" each Cues to speed up to increase challenge; c/o 2/10 wooziness upon speeding up and with mild imbalance   gait + head turns/nods  2x780fSome initial wooziness which dissipated with practice   gait + vertical/horizontal ball throw with visual tracking  2x7054fInitially with discontinuous steps which improved with practice   D2 flexion ball to [place] on floor 10x each side   2/10 sensation of "motion"  tandem walk fwd/back along counter x4 each No UE support; mild ankle/core instability     PATIENT EDUCATION: Education details: HEP update Person educated:  Patient Education method: Explanation, Demonstration, Tactile cues, Verbal cues, and Handouts Education comprehension: verbalized understanding and returned demonstration    [place] Last updated: 02/07/22 Access Code: [email or web address] URL: [email or web address] Date: 02/07/2022 Prepared by: [facility]  Exercises - Standing Gaze Stabilization with Head Rotation  - 1 x daily - 5 x weekly - 2-3 sets - 10 reps - 30  sec hold - Standing Gaze Stabilization with Head Nod   - 1 x daily - 5 x weekly - 2-3 sets - 10 reps - 30  sec hold - Standing with Head Rotation  - 1 x daily - 5 x weekly - 2 sets - 30 sec hold - Standing with Head Nod  - 1 x daily - 5 x weekly - 2 sets - 30 sec hold - Brandt-Daroff Vestibular Exercise  - 1 x daily - 5 x weekly - 2 sets - 3-5 reps     Below measures were taken at time of initial evaluation unless otherwise specified:   DIAGNOSTIC FINDINGS: n/a for this episode  COGNITION: Overall cognitive status: Within functional limits for tasks assessed   SENSATION: WFL  EDEMA:  none  MUSCLE TONE:  NT  DTRs:  NT  POSTURE:  No Significant postural limitations  Cervical ROM:    Guarded to extension due to feeling of symptoms being triggered  STRENGTH: NT  Independent in functional mobility  GAIT: Gait pattern: WFL Distance walked:  Assistive device utilized: None Level of assistance: Complete Independence Comments:     PATIENT SURVEYS:  FOTO 43  VESTIBULAR ASSESSMENT:  GENERAL OBSERVATION:    SYMPTOM BEHAVIOR:  Subjective history: Since september  Non-Vestibular symptoms: tinnitus left ear > right ear  Type of dizziness: Spinning/Vertigo  Frequency: intermittent  Duration: brief, episodic  Aggravating factors: Induced by position change: lying supine and rolling to the right and Induced by motion: looking up at the ceiling, bending down to the ground, turning body quickly, and driving, bending over  Relieving factors: head stationary, closing eyes, slow movements, and avoid busy/distracting environments  Progression of symptoms: unchanged  OCULOMOTOR EXAM:  Ocular Alignment: normal  Ocular ROM: No Limitations  Spontaneous Nystagmus: absent  Gaze-Induced Nystagmus: absent  Smooth Pursuits: intact  Saccades: intact  Convergence/Divergence: 4 cm     VESTIBULAR - OCULAR REFLEX:   Slow VOR: Normal and Comment: some difficulty with vertical as the head extension was about to trigger symptoms  and  questionable for saccadic intrusions  VOR Cancellation: Normal  Head-Impulse Test: HIT Right: negative HIT Left: positive  Dynamic Visual Acuity:  NT, may re-visit at future session   POSITIONAL TESTING: Right Dix-Hallpike: upbeating, right nystagmus Left Dix-Hallpike: no nystagmus Right Roll Test: no nystagmus Left Roll Test: no nystagmus  MOTION SENSITIVITY:  Motion Sensitivity Quotient Intensity: 0 = none, 1 = Lightheaded, 2 = Mild, 3 = Moderate, 4 = Severe, 5 = Vomiting  Intensity  1. Sitting to supine   2. Supine to L side   3. Supine to R side   4. Supine to sitting   5. L Hallpike-Dix   6. Up from L    7. R Hallpike-Dix   8. Up from R    9. Sitting, head tipped to L knee   10. Head up from L knee   11. Sitting, head tipped to R knee   12. Head up from R knee   13. Sitting head turns x5   14.Sitting head nods x5  15. In stance, 180 turn to L    16. In stance, 180 turn to R     OTHOSTATICS: not done  FUNCTIONAL GAIT: Functional gait assessment: NT  M-CTSIB  Condition 1: Firm Surface, EO 30 Sec, Normal Sway  Condition 2: Firm Surface, EC 30 Sec, Normal and Mild Sway  Condition 3: Foam Surface, EO 30 Sec, Mild Sway  Condition 4: Foam Surface, EC 30 Sec, Severe Sway     VESTIBULAR TREATMENT:                                                                                                   DATE: 01/28/22  Canalith Repositioning:  Epley Right: Number of Reps: 2 no nystagmus present on 2nd Dix-Hallpike but subjective report of very brief sensation Gaze Adaptation:  x1 Viewing Vertical:  Position: sitting and Time: 30 sec Habituation:  Other: TBD Other: pt notes that forward bending and head extension trigger symptoms  PATIENT EDUCATION: Education details: assessment findings and post-maneuver activities Person educated: Patient Education method: Explanation Education comprehension: verbalized understanding  HOME EXERCISE PROGRAM: Access Code:  [email or web address] URL: [email or web address] Date: 01/28/2022 Prepared by: Sherlyn Lees  Exercises - Seated Gaze Stabilization with Head Nod  - 1 x daily - 7 x weekly - 3-5 sets - 30 sec hold - Corner Balance Feet Together With Eyes Closed  - 1 x daily - 7 x weekly - 3 sets - 30 sec hold  GOALS: Goals reviewed with patient? Yes  SHORT TERM GOALS: Target date: 02/11/2022   The patient will be independent with HEP for gaze adaptation, habituation, balance, and general mobility. Baseline: Goal status: MET    LONG TERM GOALS: Target date: 03/18/22    Patient will be free of symptoms with forward bending and head extension in order to perform her typical work/leisure activities without symptom provocation Baseline: bending forward to mix paint, brush teeth, etc cause symptoms Goal status: MET  2.  Demonstrate improved postural stability and balance per mild sway condition 4 M-CTSIB to improve safety with low light conditions and unstable surfaces Baseline: severe x 30 sec; mild-mod Goal status: Partially met  3.  Demo improved functional activity tolerance per meeting expected outcomes on FOTO survey (62) Baseline: 43 Goal status: IN PROGRESS  4.  Demonstrate improved stability and safety with gait as evidenced by score 28/30 Functional Gait Assessment  Baseline: 23/30 02/03/22  Goal status: IN PROGRESS 02/03/22  ASSESSMENT:  CLINICAL IMPRESSION: Pt notes improvement in symptoms and has been free of positional dizziness since first session with canalith repositioning. Pt reports limited performance of VOR/gaze stabilization activities and during performance today requires small amplitude and speed of 90 bpm for horizontal/vertical and notes oscillopsia with both movements but not too perturbing per report.  Addition of balance/equilirbrum activities for HEP. Patient feels comfortable in process thus far and will hold on sessions for now to perform HEP. Will reach out to  clinic if positional vertigo reappears.  OBJECTIVE IMPAIRMENTS: decreased activity tolerance, decreased balance, decreased mobility, decreased ROM, and dizziness.   ACTIVITY LIMITATIONS: bending, bed mobility, hygiene/grooming, and  locomotion level  PARTICIPATION LIMITATIONS: meal prep, cleaning, community activity, occupation, and yard work  PERSONAL FACTORS: Time since onset of injury/illness/exacerbation are also affecting patient's functional outcome.   REHAB POTENTIAL: Excellent  CLINICAL DECISION MAKING: Stable/uncomplicated  EVALUATION COMPLEXITY: Low   PLAN:  PT FREQUENCY: 1-2x/week, plan is  2x/wk x 2 wks, then taper to 1x/wk x 3wks   PT DURATION: 8 weeks  PLANNED INTERVENTIONS: Therapeutic exercises, Therapeutic activity, Neuromuscular re-education, Balance training, Gait training, Patient/Family education, Self Care, Joint mobilization, Stair training, Vestibular training, Canalith repositioning, Aquatic Therapy, Dry Needling, Electrical stimulation, Spinal mobilization, Cryotherapy, Moist heat, and Manual therapy  PLAN FOR NEXT SESSION: reassess goals and possible DC/30 day hold; HEP review, additional VOR/corner balance   8:39 AM, 02/09/22 M. Sherlyn Lees, PT, DPT Physical Therapist- [place] Office Number: [phone]   [facility] [address], [place], [place] [address] Phone # [phone] Fax # [phone]

## 2022-02-15 ENCOUNTER — Ambulatory Visit: Payer: BC Managed Care – PPO

## 2022-02-17 ENCOUNTER — Ambulatory Visit: Payer: BC Managed Care – PPO

## 2022-04-06 NOTE — Therapy (Signed)
[facility] [address], [place], [place], [address] Phone: [phone]   Fax:  [phone]  Patient Details  Name: Kelsey Green MRN: [id number] Date of Birth: 25-Oct-1965 Referring Provider:  No ref. provider found  Encounter Date: 04/06/2022 PHYSICAL THERAPY DISCHARGE SUMMARY  Visits from Start of Care: 4  Current functional level related to goals / functional outcomes: Able to resolve positional vertigo   Remaining deficits: DNT   Education / Equipment: HEP   Patient agrees to discharge. Patient goals were partially met. Patient is being discharged due to being pleased with the current functional level.   Toniann Fail, PT 04/06/2022, 2:22 PM  [facility] [address] [place], [place], [address] Phone: [phone]   Fax:  [phone]

## 2022-10-05 ENCOUNTER — Ambulatory Visit
Admission: EM | Admit: 2022-10-05 | Discharge: 2022-10-05 | Disposition: A | Discharge status: Home / Self Care | Payer: BC Managed Care – PPO

## 2022-10-05 DIAGNOSIS — H65192 Other acute nonsuppurative otitis media, left ear: Secondary | ICD-10-CM | POA: Diagnosis not present

## 2022-10-05 DIAGNOSIS — J309 Allergic rhinitis, unspecified: Secondary | ICD-10-CM | POA: Diagnosis not present

## 2022-10-05 LAB — POCT RAPID STREP A (OFFICE): Rapid Strep A Screen: NEGATIVE

## 2022-10-05 MED ORDER — DEXAMETHASONE SODIUM PHOSPHATE 10 MG/ML IJ SOLN
10.0000 mg | Freq: Once | INTRAMUSCULAR | Status: AC
  Administered 2022-10-05: 10 mg via INTRAMUSCULAR

## 2022-10-05 NOTE — ED Triage Notes (Signed)
Pt c/o sore throat and ear pain,throat hurts to swallow on the left side also causing left ear pain x 4 days no fever.

## 2022-10-05 NOTE — ED Provider Notes (Signed)
[facility]    CSN: [id number] Arrival date & time: 10/05/22  0805      History   Chief Complaint No chief complaint on file.   HPI Kelsey Green is a 57 y.o. female.   Presenting today with 4-day history of significant sore throat and some left ear pain and pressure.  Denies difficulty drinking or breathing, fever, chills, cough, congestion, chest pain, shortness of breath.  So far trying multiple over-the-counter remedies with no relief.  Does have a history of seasonal allergies on Zyrtec and Flonase daily.  No known sick contacts recently.    Past Medical History:  Diagnosis Date   Asthma    Depression     Patient Active Problem List   Diagnosis Date Noted   Basal cell carcinoma of skin of lip 09/13/2021   Family history of cancer 09/13/2021   History of malignant neoplasm of skin 09/13/2021   Skin changes due to chronic exposure to nonionizing radiation, unspecified 09/13/2021   Nasal septal deviation 09/09/2021   Otalgia of both ears 09/09/2021   TIA (transient ischemic attack) 08/20/2021   Carotid bruit 10/02/2020   Chronic depression 10/02/2020   Mixed hyperlipidemia 10/02/2020   Neoplasm of soft tissues 10/02/2020   Reactive airway disease 10/02/2020    Past Surgical History:  Procedure Laterality Date   FRACTURE SURGERY      OB History     Gravida  1   Para  1   Term  1   Preterm      AB      Living         SAB      IAB      Ectopic      Multiple      Live Births               Home Medications    Prior to Admission medications   Medication Sig Start Date End Date Taking? Authorizing Provider  amphetamine-dextroamphetamine (ADDERALL XR) 20 MG 24 hr capsule Take 20 mg by mouth every morning. 09/26/22  Yes [provider]  budesonide-formoterol (SYMBICORT) 160-4.5 MCG/ACT inhaler Inhale 2 puffs into the lungs. 10/15/21  Yes [provider]  LORazepam (ATIVAN) 0.5 MG tablet Take 0.5 mg by mouth daily  as needed. 09/26/22  Yes [provider]  tirzepatide Greggory Keen) 10 MG/0.5ML Pen Inject 10 mg into the skin. 07/27/22  Yes [provider]  ZEPBOUND 10 MG/0.5ML Pen Inject 10 mg into the skin once a week. 09/20/22  Yes [provider]  aspirin EC (ASPIRIN ADULT LOW DOSE) 81 MG tablet Take 1 tablet (81 mg total) by mouth daily. Swallow whole. 08/15/21   Nira Conn, MD  atorvastatin (LIPITOR) 40 MG tablet Take 1 tablet (40 mg total) by mouth daily. 08/15/21 10/14/21  Nira Conn, MD  buPROPion (WELLBUTRIN SR) 200 MG 12 hr tablet Take 200 mg by mouth daily. 06/29/21   [provider]  escitalopram (LEXAPRO) 20 MG tablet Take 20 mg by mouth every morning. 06/29/21   [provider]  ezetimibe (ZETIA) 10 MG tablet Take 10 mg by mouth daily.    [provider]  fluticasone (FLONASE) 50 MCG/ACT nasal spray Place 2 sprays into both nostrils at bedtime.    [provider]  ibuprofen (ADVIL) 200 MG tablet Take 400 mg by mouth every 6 (six) hours as needed for headache or moderate pain.    [provider]  levocetirizine (XYZAL) 5 MG  tablet Take 5 mg by mouth daily.    [provider]  naproxen sodium (ALEVE) 220 MG tablet Take 220 mg by mouth daily as needed (pain/headache). 01/28/18   [provider]  NP THYROID 30 MG tablet Take 30 mg by mouth every morning. 08/02/21   [provider]    Family History Family History  Problem Relation Age of Onset   AAA (abdominal aortic aneurysm) Mother     Social History Social History   Tobacco Use   Smoking status: Never   Smokeless tobacco: Never  Vaping Use   Vaping status: Never Used  Substance Use Topics   Alcohol use: Never   Drug use: Never     Allergies   Patient has no known allergies.   Review of Systems Review of Systems Per HPI  Physical Exam Triage Vital Signs ED Triage Vitals  Encounter Vitals Group     BP 10/05/22 0812  108/75     Systolic BP Percentile --      Diastolic BP Percentile --      Pulse Rate 10/05/22 0812 86     Resp 10/05/22 0812 15     Temp 10/05/22 0812 98 F (36.7 C)     Temp Source 10/05/22 0812 Oral     SpO2 10/05/22 0812 97 %     Weight --      Height --      Head Circumference --      Peak Flow --      Pain Score 10/05/22 0814 10     Pain Loc --      Pain Education --      Exclude from Growth Chart --    No data found.  Updated Vital Signs BP 108/75 (BP Location: Right Arm)   Pulse 86   Temp 98 F (36.7 C) (Oral)   Resp 15   SpO2 97%   Visual Acuity Right Eye Distance:   Left Eye Distance:   Bilateral Distance:    Right Eye Near:   Left Eye Near:    Bilateral Near:     Physical Exam Vitals and nursing note reviewed.  Constitutional:      Appearance: Normal appearance. She is not ill-appearing.  HENT:     Head: Atraumatic.     Nose: Congestion present.     Mouth/Throat:     Mouth: Mucous membranes are moist.     Pharynx: Oropharynx is clear. Posterior oropharyngeal erythema present. No oropharyngeal exudate.  Eyes:     Extraocular Movements: Extraocular movements intact.     Conjunctiva/sclera: Conjunctivae normal.  Cardiovascular:     Rate and Rhythm: Normal rate and regular rhythm.     Heart sounds: Normal heart sounds.  Pulmonary:     Effort: Pulmonary effort is normal.     Breath sounds: Normal breath sounds.  Musculoskeletal:        General: Normal range of motion.     Cervical back: Normal range of motion and neck supple.  Lymphadenopathy:     Cervical: No cervical adenopathy.  Skin:    General: Skin is warm and dry.  Neurological:     Mental Status: She is alert and oriented to person, place, and time.  Psychiatric:        Mood and Affect: Mood normal.        Thought Content: Thought content normal.        Judgment: Judgment normal.      UC Treatments / Results  Labs (all labs ordered are listed, but only abnormal results are  displayed) Labs Reviewed  POCT RAPID STREP A (OFFICE)    EKG   Radiology No results found.  Procedures Procedures (including critical care time)  Medications Ordered in UC Medications  dexamethasone (DECADRON) injection 10 mg (10 mg Intramuscular Given 10/05/22 0837)    Initial Impression / Assessment and Plan / UC Course  I have reviewed the triage vital signs and the nursing notes.  Pertinent labs & imaging results that were available during my care of the patient were reviewed by me and considered in my medical decision making (see chart for details).     Suspect allergic in nature, treat with IM Decadron, antihistamines, Flonase, salt water gargles and Chloraseptic spray.  Return for worsening symptoms.  Rapid strep was negative.  Final Clinical Impressions(s) / UC Diagnoses   Final diagnoses:  Allergic sinusitis  Acute MEE (middle ear effusion), left   Discharge Instructions   None    ED Prescriptions   None    PDMP not reviewed this encounter.   Particia Nearing, [place] 10/05/22 1426

## 2024-04-25 ENCOUNTER — Other Ambulatory Visit: Payer: Self-pay | Admitting: Internal Medicine

## 2024-04-25 DIAGNOSIS — Z1231 Encounter for screening mammogram for malignant neoplasm of breast: Secondary | ICD-10-CM

## 2024-05-07 ENCOUNTER — Ambulatory Visit
